# Patient Record
Sex: Female | Born: 1950 | Hispanic: Yes | Marital: Married | Smoking: Former smoker
Health system: Southern US, Community
[De-identification: ages and names within clinical notes are randomized; demographics above are authoritative.]

## PROBLEM LIST (undated history)

## (undated) DIAGNOSIS — E079 Disorder of thyroid, unspecified: Secondary | ICD-10-CM

## (undated) DIAGNOSIS — F419 Anxiety disorder, unspecified: Secondary | ICD-10-CM

## (undated) DIAGNOSIS — I10 Essential (primary) hypertension: Secondary | ICD-10-CM

## (undated) DIAGNOSIS — E119 Type 2 diabetes mellitus without complications: Secondary | ICD-10-CM

## (undated) DIAGNOSIS — E039 Hypothyroidism, unspecified: Secondary | ICD-10-CM

## (undated) HISTORY — DX: Type 2 diabetes mellitus without complications: E11.9

## (undated) HISTORY — PX: COSMETIC SURGERY: SHX468

## (undated) HISTORY — DX: Essential (primary) hypertension: I10

## (undated) HISTORY — DX: Disorder of thyroid, unspecified: E07.9

## (undated) HISTORY — DX: Anxiety disorder, unspecified: F41.9

---

## 2014-03-07 HISTORY — PX: GALLBLADDER SURGERY: SHX652

## 2014-06-06 HISTORY — PX: SHOULDER SURGERY: SHX246

## 2015-06-03 ENCOUNTER — Ambulatory Visit (INDEPENDENT_AMBULATORY_CARE_PROVIDER_SITE_OTHER): Payer: Self-pay | Admitting: Family Medicine

## 2015-06-03 ENCOUNTER — Ambulatory Visit (INDEPENDENT_AMBULATORY_CARE_PROVIDER_SITE_OTHER): Payer: Self-pay

## 2015-06-03 VITALS — BP 114/70 | HR 71 | Temp 97.7°F | Resp 17

## 2015-06-03 DIAGNOSIS — M25572 Pain in left ankle and joints of left foot: Secondary | ICD-10-CM

## 2015-06-03 DIAGNOSIS — S93402A Sprain of unspecified ligament of left ankle, initial encounter: Secondary | ICD-10-CM

## 2015-06-03 MED ORDER — NAPROXEN SODIUM 550 MG PO TABS: 550.0000 mg | ORAL_TABLET | Freq: Two times a day (BID) | ORAL | Status: DC

## 2015-06-03 NOTE — Progress Notes (Signed)
    MRN: [id number] DOB: Jan 03, 1951  Subjective:   Heather Vang is a 64 y.o. female presenting for chief complaint of Foot Swelling  Reports 9 day history of left ankle swelling since she twisted her ankle walking down steps. she felt severe pain in her left ankle thereafter, had a lot of difficulty ambulating on her own. In the following days, she experienced significant swelling of her left ankle and bruising. She reports that the swelling and bruising has improved but has not resolved and she feels pain with walking and foot movement. She has tried icing a couple of times, Flanax without significant relief. Of note, patient has not rested, she is not elevating her leg either, she walks daily. Denies any other aggravating or relieving factors, no other questions or concerns.  Jaquel has a current medication list which includes the following prescription(s): amlodipine, estazolam, levothyroxine, metformin, and aspirin. She has No Known Allergies.  Iram  has a past medical history of Diabetes mellitus without complication; Anxiety; Hypertension; and Thyroid disease. Also  has past surgical history that includes Cosmetic surgery; Gallbladder surgery (03/07/2014); and Shoulder surgery (06/06/2014).  ROS As in subjective.  Objective:   Vitals: BP 114/70 mmHg  Pulse 71  Temp(Src) 97.7 F (36.5 C) (Oral)  Resp 17  LMP   Physical Exam  Constitutional: She is oriented to person, place, and time. She appears well-developed and well-nourished.  Cardiovascular: Normal rate.   Pulmonary/Chest: Effort normal.  Musculoskeletal:       Left ankle: She exhibits decreased range of motion (inversion and eversion) and swelling. She exhibits no ecchymosis, no deformity and no laceration. Tenderness. Lateral malleolus, AITFL and head of 5th metatarsal tenderness found. No medial malleolus, no posterior TFL and no proximal fibula tenderness found.  Neurological: She is alert and oriented to person,  place, and time.  Skin: Skin is warm and dry. No rash noted. No erythema. No pallor.   [facility] reading (PRIMARY) by  Dr. Marin Comment and PA-Nataliyah Packham. Left ankle - possible avulsion fracture near base of 5th versus chronic bony abnormality.  Dg Ankle Complete Left  06/03/2015   CLINICAL DATA:  Pain following fall  EXAM: LEFT ANKLE COMPLETE - 3+ VIEW  COMPARISON:  None.  FINDINGS: Frontal, oblique, and lateral views were obtained. There is mild generalized soft tissue swelling. There is no fracture or joint effusion. The ankle mortise appears intact. There is a spur arising from the inferior calcaneus. There is an accessory ossicle lateral to the cuboid.  IMPRESSION: Accessory ossicle lateral to cuboid. Small inferior calcaneal spur. No fracture. Mortise intact. There is mild generalized soft tissue swelling.   Electronically Signed   By: Lowella Grip III M.D.   On: 06/03/2015 11:56   Assessment and Plan :   1. Left ankle sprain, initial encounter 2. Left ankle pain - X-ray reassuring, advised RICE, anaprox for pain and inflammation, patient declined ankle brace here in office and plans on obtaining her own - RTC if no improvement in 2 weeks, consider PT, ortho  Jaynee Eagles, PA-C [facility] [place] [phone] 06/03/2015 11:35 AM

## 2015-06-03 NOTE — Patient Instructions (Signed)
Esguince de tobillo (Ankle Sprain)  Un esguince de tobillo es una lesin en los tejidos fuertes y fibrosos (ligamentos) que mantienen unidos los huesos de la articulacin del tobillo.  CAUSAS  Las causas pueden ser una cada o la torcedura del tobillo. Los esguinces de tobillo ocurren con ms frecuencia al pisar con el borde exterior del pie, lo que hace que el tobillo se vuelva hacia adentro. Las personas que practican deportes son ms propensas a este tipo de lesiones.  SNTOMAS   Dolor en el tobillo. El dolor puede aparecer durante el reposo o slo al tratar de ponerse de pie o caminar.  Hinchazn.  Hematomas. Los hematomas pueden aparecer inmediatamente o luego de 1 a 2 das despus de la lesin.  Dificultad para pararse o caminar, especialmente al doblar en esquinas o al cambiar de direccin. DIAGNSTICO  El mdico le preguntar detalles acerca de la lesin y le har un examen fsico del tobillo para determinar si tiene un esguince. Durante el examen fsico, el mdico apretar y Midwife presin en reas especficas del pie y del tobillo. El mdico tratar de Film/video editor tobillo en ciertas direcciones. Le indicarn una radiografa para descartar la fractura de un hueso o que un ligamento no se haya separado de uno de los huesos del tobillo (fractura por avulsin).  TRATAMIENTO  Algunos tipos de soporte podrn ayudarlo a estabilizar el tobillo. El profesional que lo asiste le dar las indicaciones. Tambin podr indicarle que use medicamentos para Glass blower/designer. Si el esguince es grave, su mdico podr derivarlo a un cirujano que lo ayudar a Secretary/administrator funcin de las partes afectadas del sistema esqueltico (ortopedista) o a un fisioterapeuta.  INSTRUCCIONES PARA EL CUIDADO EN EL HOGAR   Aplique hielo en la articulacin lesionada durante 1  2 das o segn lo que le indique su mdico. La aplicacin del hielo ayuda a reducir la inflamacin y Conservation officer, historic buildings.  Ponga el hielo en una bolsa  plstica.  Colquese una toalla entre la piel y la bolsa de hielo.  Deje el hielo en el lugar durante 15 a 20 minutos por vez, cada 2 horas mientras est despierto.  Slo tome medicamentos de venta libre o recetados para Glass blower/designer, las molestias o bajar la fiebre segn las indicaciones de su mdico.  Eleve el tobillo lesionado por encima del nivel del corazn tanto como pueda durante 2 o 3 das.  Si su mdico le indica el uso de [place], selas segn las instrucciones. Gradualmente lleve el peso sobre el tobillo [place]. Siga usando muletas o un bastn hasta que pueda caminar sin sentir dolor en el tobillo.  Si tiene una frula de yeso, sela como lo indique su mdico. No se apoye en ninguna cosa ms dura que una [employer] primeras 24 horas. No ponga peso sobre la frula. No permita que se moje. Puede quitrsela para tomar una ducha o un bao.  Pueden haberle colocado un vendaje elstico para usar alrededor del tobillo para darle soporte. Si el vendaje elstico est muy ajustado (siente adormecimiento u hormigueo o el pie est fro y [place]), ajstelo para que sea ms cmodo.  Si usted tiene una frula de [place], puede soplar o dejar salir el aire para que sea ms cmodo. Puede quitarse la frula por la noche y antes de tomar una ducha o un bao. Mueva los dedos de los pies en la frula varias veces al da para disminuir la hinchazn. SOLICITE ATENCIN MDICA SI:  Le aumenta rpidamente el [place].  Los dedos de los pies estn extremadamente fros o pierde la sensibilidad en el pie.  El dolor no se alivia con los [employer]. SOLICITE ATENCIN MDICA DE INMEDIATO SI:   Los dedos de los pies estn adormecidos o de [employer].  Tiene un dolor agudo que va aumentando. ASEGRESE DE QUE:   Comprende estas instrucciones.  Controlar su enfermedad.  Solicitar ayuda de inmediato si no mejora o empeora. Document Released: 11/23/2005 Document Revised:  08/17/2012 [facility] Patient Information 2015 [place]. This information is not intended to replace advice given to you by your health care provider. Make sure you discuss any questions you have with your health care provider.

## 2015-06-11 NOTE — Progress Notes (Signed)
The recorded information has been reviewed and considered. Agree with A/P. Dr Marin Comment

## 2016-02-19 IMAGING — CR DG ANKLE COMPLETE 3+V*L*
4 series · 4 of 4 positions shown · non-contrast
Comparison: None.

CLINICAL DATA: Pain following fall

EXAM:
LEFT ANKLE COMPLETE - 3+ VIEW

[AP]
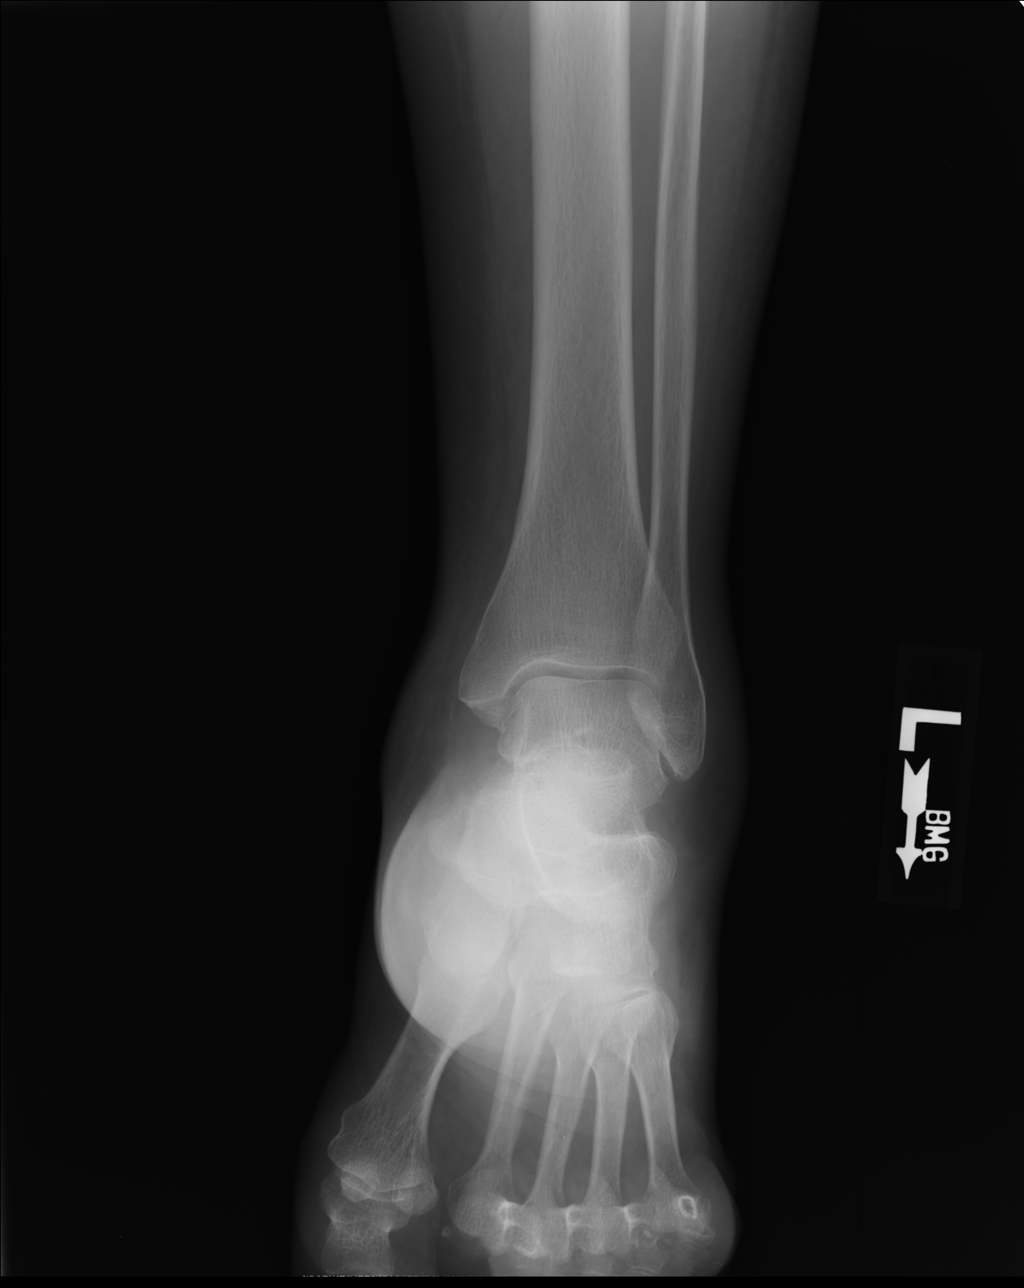

[ap obl int rot]
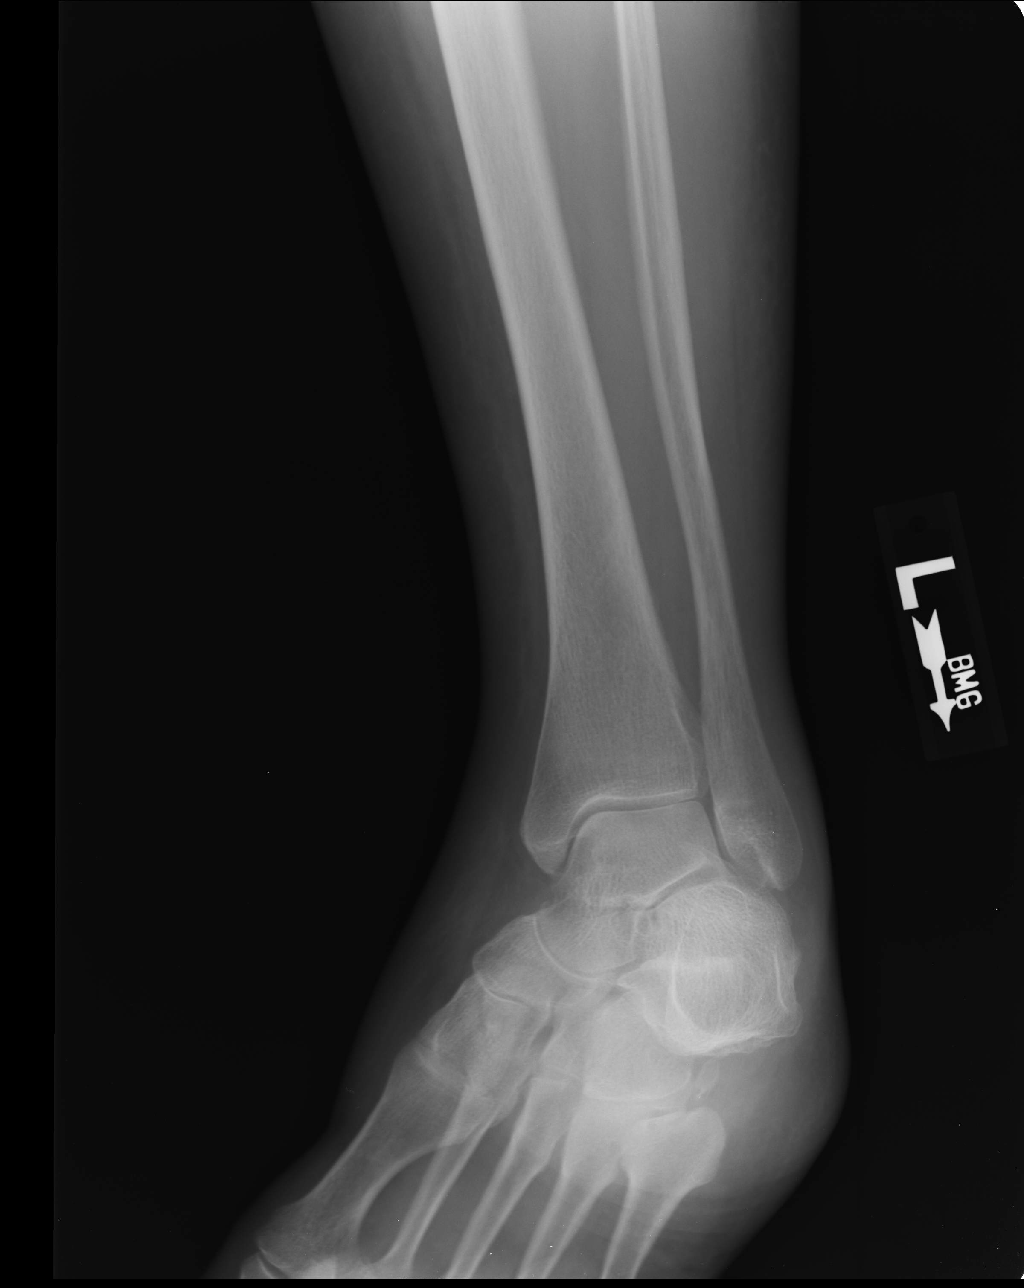

[medial obl]
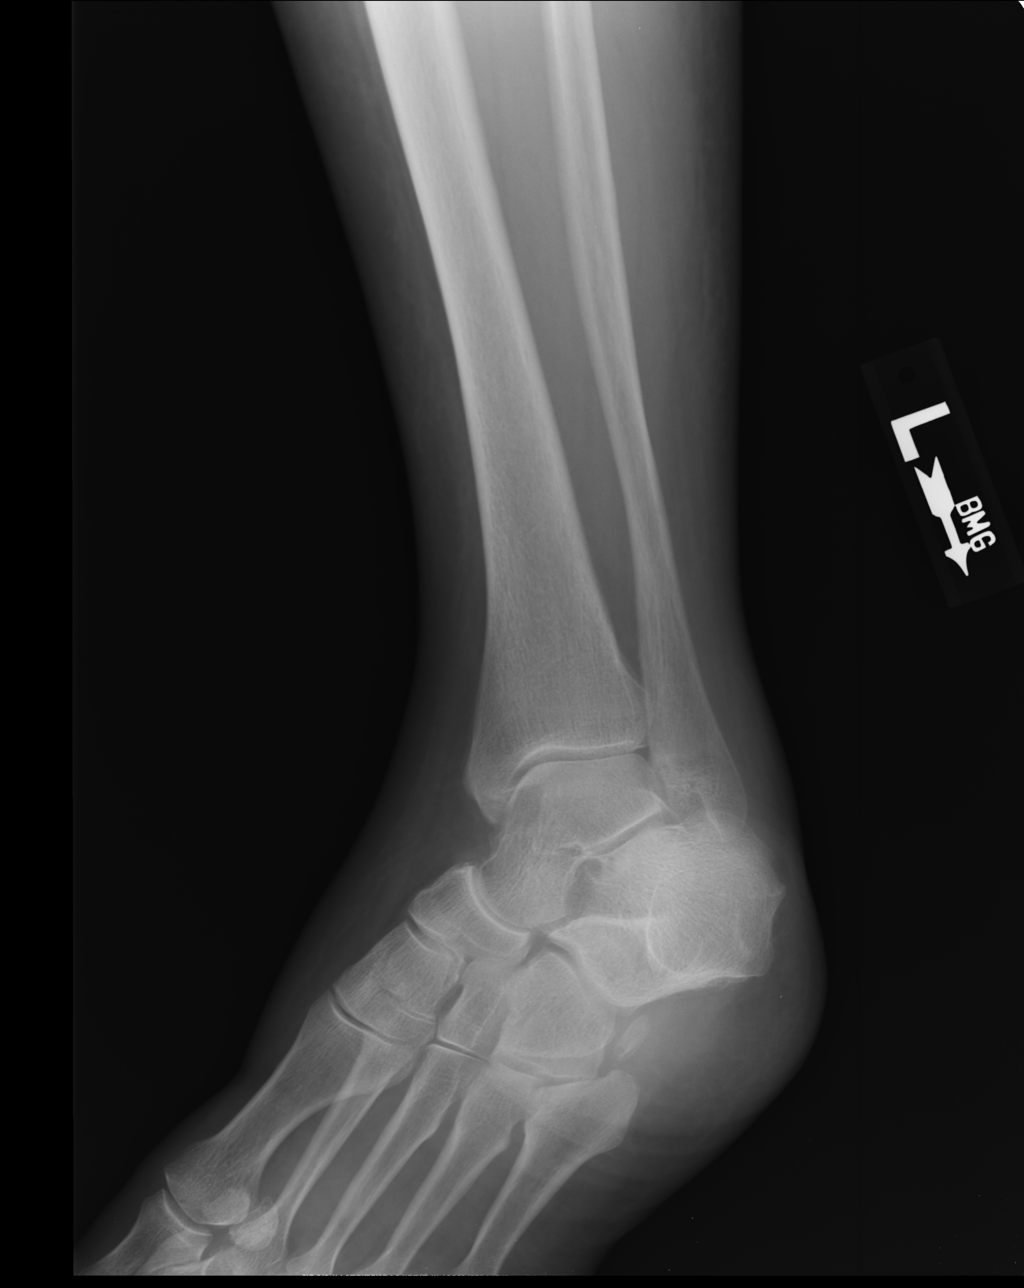

[lateral]
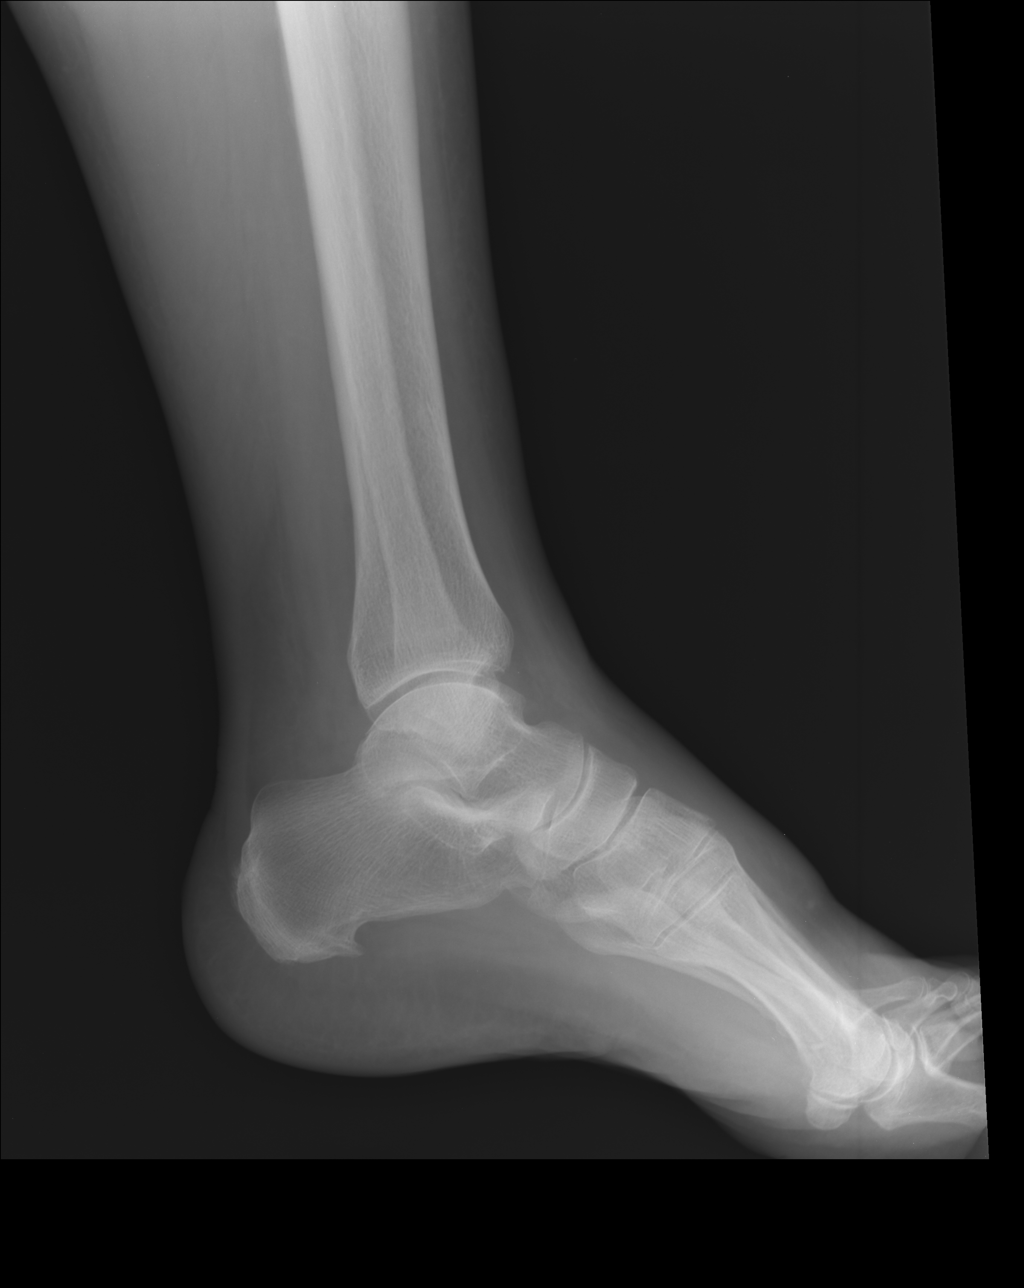

[4 of 4 positions shown; findings below may reference images not displayed]

FINDINGS: Frontal, oblique, and lateral views were obtained. There is mild
generalized soft tissue swelling. There is no fracture or joint
effusion. The ankle mortise appears intact. There is a spur arising
from the inferior calcaneus. There is an accessory ossicle lateral
to the cuboid.
IMPRESSION: Accessory ossicle lateral to cuboid. Small inferior calcaneal spur.
No fracture. Mortise intact. There is mild generalized soft tissue
swelling.

## 2022-07-01 ENCOUNTER — Encounter (HOSPITAL_COMMUNITY): Payer: Self-pay

## 2022-07-01 ENCOUNTER — Emergency Department (HOSPITAL_COMMUNITY): Payer: Self-pay

## 2022-07-01 ENCOUNTER — Inpatient Hospital Stay (HOSPITAL_COMMUNITY)
Admission: EM | Admit: 2022-07-01 | Discharge: 2022-07-14 | DRG: 853 | Disposition: A | Discharge status: Home / Self Care | Payer: Self-pay | Attending: Family Medicine | Admitting: Family Medicine

## 2022-07-01 DIAGNOSIS — T801XXA Vascular complications following infusion, transfusion and therapeutic injection, initial encounter: Secondary | ICD-10-CM | POA: Diagnosis not present

## 2022-07-01 DIAGNOSIS — Z79899 Other long term (current) drug therapy: Secondary | ICD-10-CM

## 2022-07-01 DIAGNOSIS — K5792 Diverticulitis of intestine, part unspecified, without perforation or abscess without bleeding: Secondary | ICD-10-CM

## 2022-07-01 DIAGNOSIS — Z933 Colostomy status: Principal | ICD-10-CM

## 2022-07-01 DIAGNOSIS — Z5331 Laparoscopic surgical procedure converted to open procedure: Secondary | ICD-10-CM

## 2022-07-01 DIAGNOSIS — Z833 Family history of diabetes mellitus: Secondary | ICD-10-CM

## 2022-07-01 DIAGNOSIS — Y848 Other medical procedures as the cause of abnormal reaction of the patient, or of later complication, without mention of misadventure at the time of the procedure: Secondary | ICD-10-CM | POA: Diagnosis not present

## 2022-07-01 DIAGNOSIS — K5780 Diverticulitis of intestine, part unspecified, with perforation and abscess without bleeding: Secondary | ICD-10-CM

## 2022-07-01 DIAGNOSIS — I10 Essential (primary) hypertension: Secondary | ICD-10-CM | POA: Diagnosis present

## 2022-07-01 DIAGNOSIS — Z79890 Hormone replacement therapy (postmenopausal): Secondary | ICD-10-CM

## 2022-07-01 DIAGNOSIS — A419 Sepsis, unspecified organism: Principal | ICD-10-CM | POA: Diagnosis present

## 2022-07-01 DIAGNOSIS — I808 Phlebitis and thrombophlebitis of other sites: Secondary | ICD-10-CM | POA: Diagnosis not present

## 2022-07-01 DIAGNOSIS — K5720 Diverticulitis of large intestine with perforation and abscess without bleeding: Secondary | ICD-10-CM | POA: Diagnosis present

## 2022-07-01 DIAGNOSIS — E876 Hypokalemia: Secondary | ICD-10-CM | POA: Diagnosis not present

## 2022-07-01 DIAGNOSIS — E039 Hypothyroidism, unspecified: Secondary | ICD-10-CM | POA: Diagnosis present

## 2022-07-01 DIAGNOSIS — E119 Type 2 diabetes mellitus without complications: Secondary | ICD-10-CM | POA: Diagnosis present

## 2022-07-01 DIAGNOSIS — K658 Other peritonitis: Secondary | ICD-10-CM | POA: Diagnosis present

## 2022-07-01 LAB — CBC WITH DIFFERENTIAL/PLATELET
Abs Immature Granulocytes: 0.11 10*3/uL — ABNORMAL HIGH (ref 0.00–0.07)
Basophils Absolute: 0 10*3/uL (ref 0.0–0.1)
Basophils Relative: 0 %
Eosinophils Absolute: 0 10*3/uL (ref 0.0–0.5)
Eosinophils Relative: 0 %
HCT: 43 % (ref 36.0–46.0)
Hemoglobin: 14.4 g/dL (ref 12.0–15.0)
Immature Granulocytes: 1 %
Lymphocytes Relative: 8 %
Lymphs Abs: 1.7 10*3/uL (ref 0.7–4.0)
MCH: 30.3 pg (ref 26.0–34.0)
MCHC: 33.5 g/dL (ref 30.0–36.0)
MCV: 90.5 fL (ref 80.0–100.0)
Monocytes Absolute: 0.8 10*3/uL (ref 0.1–1.0)
Monocytes Relative: 4 %
Neutro Abs: 17.9 10*3/uL — ABNORMAL HIGH (ref 1.7–7.7)
Neutrophils Relative %: 87 %
Platelets: 275 10*3/uL (ref 150–400)
RBC: 4.75 MIL/uL (ref 3.87–5.11)
RDW: 14.3 % (ref 11.5–15.5)
WBC: 20.7 10*3/uL — ABNORMAL HIGH (ref 4.0–10.5)
nRBC: 0 % (ref 0.0–0.2)

## 2022-07-01 LAB — URINALYSIS, ROUTINE W REFLEX MICROSCOPIC
Bilirubin Urine: NEGATIVE
Glucose, UA: >=500 mg/dL — AB
Hgb urine dipstick: NEGATIVE
Ketones, ur: 20 mg/dL — AB
Nitrite: NEGATIVE
Protein, ur: 30 mg/dL — AB
Specific Gravity, Urine: 1.031 — ABNORMAL HIGH (ref 1.005–1.030)
pH: 6 (ref 5.0–8.0)

## 2022-07-01 LAB — COMPREHENSIVE METABOLIC PANEL
ALT: 19 U/L (ref 0–44)
AST: 13 U/L — ABNORMAL LOW (ref 15–41)
Albumin: 4 g/dL (ref 3.5–5.0)
Alkaline Phosphatase: 59 U/L (ref 38–126)
Anion gap: 13 (ref 5–15)
BUN: 18 mg/dL (ref 8–23)
CO2: 20 mmol/L — ABNORMAL LOW (ref 22–32)
Calcium: 9.5 mg/dL (ref 8.9–10.3)
Chloride: 106 mmol/L (ref 98–111)
Creatinine, Ser: 0.82 mg/dL (ref 0.44–1.00)
GFR, Estimated: >60 mL/min (ref >60)
Glucose, Bld: 139 mg/dL — ABNORMAL HIGH (ref 70–99)
Potassium: 3.5 mmol/L (ref 3.5–5.1)
Sodium: 139 mmol/L (ref 135–145)
Total Bilirubin: 1 mg/dL (ref 0.3–1.2)
Total Protein: 8 g/dL (ref 6.5–8.1)

## 2022-07-01 LAB — LIPASE, BLOOD: Lipase: 22 U/L (ref 11–51)

## 2022-07-01 LAB — MAGNESIUM: Magnesium: 2.1 mg/dL (ref 1.7–2.4)

## 2022-07-01 MED ORDER — PIPERACILLIN-TAZOBACTAM 3.375 G IVPB 30 MIN
3.3750 g | Freq: Once | INTRAVENOUS | Status: AC
Start: 2022-07-01 — End: 2022-07-01
  Administered 2022-07-01: 3.375 g via INTRAVENOUS
  Filled 2022-07-01: qty 50

## 2022-07-01 MED ORDER — FENTANYL CITRATE PF 50 MCG/ML IJ SOSY: 25.0000 ug | PREFILLED_SYRINGE | INTRAMUSCULAR | Status: DC | PRN

## 2022-07-01 MED ORDER — ONDANSETRON HCL 4 MG/2ML IJ SOLN
4.0000 mg | Freq: Four times a day (QID) | INTRAMUSCULAR | Status: DC | PRN
  Administered 2022-07-05 – 2022-07-09 (×2): 4 mg via INTRAVENOUS
  Filled 2022-07-01: qty 2

## 2022-07-01 MED ORDER — ACETAMINOPHEN 325 MG PO TABS
650.0000 mg | ORAL_TABLET | Freq: Four times a day (QID) | ORAL | Status: DC | PRN
Start: 2022-07-01 — End: 2022-07-08
  Administered 2022-07-08: 650 mg via ORAL
  Filled 2022-07-01 (×2): qty 2

## 2022-07-01 MED ORDER — OXYCODONE-ACETAMINOPHEN 5-325 MG PO TABS
1.0000 | ORAL_TABLET | Freq: Once | ORAL | Status: AC
  Administered 2022-07-01: 1 via ORAL
  Filled 2022-07-01: qty 1

## 2022-07-01 MED ORDER — SODIUM CHLORIDE 0.9 % IV BOLUS
1000.0000 mL | Freq: Once | INTRAVENOUS | Status: AC
  Administered 2022-07-01: 1000 mL via INTRAVENOUS

## 2022-07-01 MED ORDER — ONDANSETRON HCL 4 MG/2ML IJ SOLN
4.0000 mg | Freq: Once | INTRAMUSCULAR | Status: AC
  Administered 2022-07-01: 4 mg via INTRAVENOUS
  Filled 2022-07-01: qty 2

## 2022-07-01 MED ORDER — LACTATED RINGERS IV SOLN: INTRAVENOUS | Status: DC

## 2022-07-01 MED ORDER — PIPERACILLIN-TAZOBACTAM 3.375 G IVPB
3.3750 g | Freq: Three times a day (TID) | INTRAVENOUS | Status: DC
  Administered 2022-07-02 – 2022-07-14 (×35): 3.375 g via INTRAVENOUS
  Filled 2022-07-01 (×34): qty 50

## 2022-07-01 MED ORDER — IOHEXOL 300 MG/ML  SOLN
100.0000 mL | Freq: Once | INTRAMUSCULAR | Status: AC | PRN
  Administered 2022-07-01: 100 mL via INTRAVENOUS

## 2022-07-01 MED ORDER — LACTATED RINGERS IV BOLUS
1000.0000 mL | Freq: Once | INTRAVENOUS | Status: AC
  Administered 2022-07-01: 1000 mL via INTRAVENOUS

## 2022-07-01 MED ORDER — FENTANYL CITRATE PF 50 MCG/ML IJ SOSY
50.0000 ug | PREFILLED_SYRINGE | INTRAMUSCULAR | Status: DC | PRN
  Administered 2022-07-02: 50 ug via INTRAVENOUS
  Filled 2022-07-01: qty 1

## 2022-07-01 MED ORDER — MORPHINE SULFATE (PF) 4 MG/ML IV SOLN
4.0000 mg | Freq: Once | INTRAVENOUS | Status: AC
  Administered 2022-07-01: 4 mg via INTRAVENOUS
  Filled 2022-07-01: qty 1

## 2022-07-01 MED ORDER — INSULIN ASPART 100 UNIT/ML IJ SOLN
0.0000 [IU] | Freq: Four times a day (QID) | INTRAMUSCULAR | Status: DC
  Filled 2022-07-01: qty 0.06

## 2022-07-01 MED ORDER — NALOXONE HCL 0.4 MG/ML IJ SOLN: 0.4000 mg | INTRAMUSCULAR | Status: DC | PRN

## 2022-07-01 MED ORDER — ACETAMINOPHEN 650 MG RE SUPP: 650.0000 mg | Freq: Four times a day (QID) | RECTAL | Status: DC | PRN

## 2022-07-01 NOTE — Progress Notes (Signed)
Pharmacy Antibiotic Note  Heather Vang is a 71 y.o. female who presented to the ED on 07/01/2022 with c/o abdominal pain.  Abdominal CT showed findings consistent with perforated diverticulitis and cholelithiasis. Pharmacy has been consulted to dose zosyn for intra-abdominal infection.  Plan: - zosyn 3.375 gm IV q8h (infuse over 4 hrs) - With good renal function, pharmacy will sign off for abx consult.  Reconsult [place] if need further assistance. __________________________________________  Height: 5' 0.24" (153 cm) Weight: 70 kg (154 lb 5.2 oz) IBW/kg (Calculated) : 46.04  Temp (24hrs), Avg:98.2 F (36.8 C), Min:97.8 F (36.6 C), Max:98.5 F (36.9 C)  Recent Labs  Lab 07/01/22 1637  WBC 20.7*  CREATININE 0.82    Estimated Creatinine Clearance: 57.6 mL/min (by C-G formula based on SCr of 0.82 mg/dL).    No Known Allergies   Thank you for allowing pharmacy to be a part of this patient's care.  Lynelle Doctor 07/01/2022 9:10 PM

## 2022-07-01 NOTE — ED Triage Notes (Signed)
Interpreter offered: Pt wishes to have her family member translate at this time. PT c/o abdominal pain, nausea, and vomiting since Monday.

## 2022-07-01 NOTE — ED Provider Notes (Signed)
[facility] DEPT Provider Note   CSN: [id number] Arrival date & time: 07/01/22  1530     History  Chief Complaint  Patient presents with   Abdominal Pain    Heather Vang is a 71 y.o. female.  71 yo F with a chief complaints of diffuse abdominal discomfort.  This been going on since Monday so about 3 days, diffusely about the abdomen and has had some nausea but no vomiting no diarrhea no constipation no fevers.  Feels like she is bloated.  Has never had a prior like this happen to her before.  Denies prior history of abdominal surgery.   Abdominal Pain      Home Medications Prior to Admission medications   Medication Sig Start Date End Date Taking? Authorizing Provider  amLODipine (NORVASC) 5 MG tablet Take 5 mg by mouth daily.    [provider]  aspirin 81 MG tablet Take 81 mg by mouth daily.    [provider]  estazolam (PROSOM) 2 MG tablet Take 2 mg by mouth at bedtime.    [provider]  levothyroxine (SYNTHROID, LEVOTHROID) 175 MCG tablet Take 175 mcg by mouth daily before breakfast.    [provider]  metFORMIN (GLUCOPHAGE-XR) 500 MG 24 hr tablet Take 500 mg by mouth daily with breakfast.    [provider]  naproxen sodium (ANAPROX DS) 550 MG tablet Take 1 tablet (550 mg total) by mouth 2 (two) times daily with a meal. 06/03/15   Jaynee Eagles, PA-C      Allergies    Patient has no known allergies.    Review of Systems   Review of Systems  Gastrointestinal:  Positive for abdominal pain.    Physical Exam Updated Vital Signs BP (!) 105/50   Pulse 87   Temp 97.8 F (36.6 C) (Oral)   Resp 16   Ht 5' 0.24" (1.53 m)   Wt 70 kg   SpO2 93%   BMI 29.90 kg/m  Physical Exam Vitals and nursing note reviewed.  Constitutional:      General: She is not in acute distress.    Appearance: She is well-developed. She is not diaphoretic.  HENT:     Head: Normocephalic and atraumatic.   Eyes:     Pupils: Pupils are equal, round, and reactive to light.  Cardiovascular:     Rate and Rhythm: Normal rate and regular rhythm.     Heart sounds: No murmur heard.    No friction rub. No gallop.  Pulmonary:     Effort: Pulmonary effort is normal.     Breath sounds: No wheezing or rales.  Abdominal:     General: There is no distension.     Palpations: Abdomen is soft.     Tenderness: There is no abdominal tenderness.     Comments: Mild distention tympanic to percussion.  Mild diffuse abdominal discomfort worse on the right side than the left.  Musculoskeletal:        General: No tenderness.     Cervical back: Normal range of motion and neck supple.  Skin:    General: Skin is warm and dry.  Neurological:     Mental Status: She is alert and oriented to person, place, and time.  Psychiatric:        Behavior: Behavior normal.     ED Results / Procedures / Treatments   Labs (all labs ordered are listed, but only abnormal results are displayed) Labs Reviewed  CBC  WITH DIFFERENTIAL/PLATELET - Abnormal; Notable for the following components:      Result Value   WBC 20.7 (*)    Neutro Abs 17.9 (*)    Abs Immature Granulocytes 0.11 (*)    All other components within normal limits  COMPREHENSIVE METABOLIC PANEL - Abnormal; Notable for the following components:   CO2 20 (*)    Glucose, Bld 139 (*)    AST 13 (*)    All other components within normal limits  URINALYSIS, ROUTINE W REFLEX MICROSCOPIC - Abnormal; Notable for the following components:   Specific Gravity, Urine 1.031 (*)    Glucose, UA >=500 (*)    Ketones, ur 20 (*)    Protein, ur 30 (*)    Leukocytes,Ua SMALL (*)    Bacteria, UA RARE (*)    All other components within normal limits  LIPASE, BLOOD    EKG None  Radiology CT Abdomen Pelvis W Contrast  Result Date: 07/01/2022 CLINICAL DATA:  Bowel obstruction suspected. Abdominal pain, nausea, and vomiting since Monday. EXAM: CT ABDOMEN AND PELVIS WITH  CONTRAST TECHNIQUE: Multidetector CT imaging of the abdomen and pelvis was performed using the standard protocol following bolus administration of intravenous contrast. RADIATION DOSE REDUCTION: This exam was performed according to the departmental dose-optimization program which includes automated exposure control, adjustment of the mA and/or kV according to patient size and/or use of iterative reconstruction technique. CONTRAST:  117m OMNIPAQUE IOHEXOL 300 MG/ML  SOLN COMPARISON:  None Available. FINDINGS: Lower chest: Streaky infiltrates in the lung bases, likely linear atelectasis. Small esophageal hiatal hernia. Hepatobiliary: No focal liver lesions. Vague densities in the gallbladder likely representing small non radiopaque stones. No gallbladder wall thickening. No bile duct dilatation. Pancreas: Unremarkable. No pancreatic ductal dilatation or surrounding inflammatory changes. Spleen: Normal in size without focal abnormality. Adrenals/Urinary Tract: Adrenal glands are unremarkable. Kidneys are normal, without renal calculi, focal lesion, or hydronephrosis. Bladder is unremarkable. Stomach/Bowel: Stomach, small bowel, and colon are not abnormally distended. Fluid-filled nondistended small bowel are present. Diverticulosis of the sigmoid colon with stranding around the mid/distal descending colon. Changes are consistent with acute diverticulitis. Mild wall thickening of adjacent small bowel suggesting reactive inflammation or possibly enteritis. Appendix is normal. Vascular/Lymphatic: Aortic atherosclerosis. No enlarged abdominal or pelvic lymph nodes. Reproductive: Uterus and bilateral adnexa are unremarkable. Other: Diffuse free abdominal air is demonstrated with air extending into the abdominal hiatus. In the absence of recent surgery, this likely represents bowel perforation. Given additional finding of apparent acute diverticulitis, this likely represents a perforated diverticulitis. No free fluid in the  abdomen. Abdominal wall musculature appears intact. Musculoskeletal: No acute or significant osseous findings. IMPRESSION: 1. Diverticulosis of the colon with pericolonic inflammatory changes in the mid/distal descending colon consistent with acute diverticulitis. 2. Diffuse free air in the abdomen likely indicates perforated diverticulitis. 3. Small bowel wall thickening in the left abdomen likely represents reactive inflammation although enteritis could also have this appearance. 4. Cholelithiasis without evidence of acute cholecystitis. 5. Linear atelectasis in the lung bases. Critical Value/emergent results were called by telephone at the time of interpretation on 07/01/2022 at 7:55 pm to provider Dr. FTyrone Nine who verbally acknowledged these results. Electronically Signed   By: WLucienne CapersM.D.   On: 07/01/2022 20:02    Procedures Procedures    Medications Ordered in ED Medications  piperacillin-tazobactam (ZOSYN) IVPB 3.375 g (3.375 g Intravenous New Bag/Given 07/01/22 2016)  oxyCODONE-acetaminophen (PERCOCET/ROXICET) 5-325 MG per tablet 1 tablet (1 tablet Oral Given 07/01/22 1648)  iohexol (OMNIPAQUE) 300 MG/ML solution 100 mL (100 mLs Intravenous Contrast Given 07/01/22 1941)  morphine (PF) 4 MG/ML injection 4 mg (4 mg Intravenous Given 07/01/22 2015)  ondansetron (ZOFRAN) injection 4 mg (4 mg Intravenous Given 07/01/22 2013)  sodium chloride 0.9 % bolus 1,000 mL (1,000 mLs Intravenous New Bag/Given 07/01/22 2016)    ED Course/ Medical Decision Making/ A&P Clinical Course as of 07/01/22 2038  Wed Jul 01, 2022  1714 WBC(!): 20.7 [GL]    Clinical Course User Index [GL] Adolphus Birchwood, PA-C                           Medical Decision Making Risk Prescription drug management.   71 yo F with a chief complaints of abdominal pain.  This was diffusely about the abdomen going on for about 3 days.  Pain worse on the right than the left.  White count of almost 21,000, no LFT elevation lipase  normal.  CT scan of the abdomen pelvis concerning for perforated diverticulitis.  I discussed this with the radiologist.  I discussed the case with general surgery, Dr. Ninfa Linden will come and evaluate patient at bedside.  We will give the patient broad-spectrum antibiotics.  Will discuss with medicine for admission.  The patients results and plan were reviewed and discussed.   Any x-rays performed were independently reviewed by myself.   Differential diagnosis were considered with the presenting HPI.  Medications  piperacillin-tazobactam (ZOSYN) IVPB 3.375 g (3.375 g Intravenous New Bag/Given 07/01/22 2016)  oxyCODONE-acetaminophen (PERCOCET/ROXICET) 5-325 MG per tablet 1 tablet (1 tablet Oral Given 07/01/22 1648)  iohexol (OMNIPAQUE) 300 MG/ML solution 100 mL (100 mLs Intravenous Contrast Given 07/01/22 1941)  morphine (PF) 4 MG/ML injection 4 mg (4 mg Intravenous Given 07/01/22 2015)  ondansetron (ZOFRAN) injection 4 mg (4 mg Intravenous Given 07/01/22 2013)  sodium chloride 0.9 % bolus 1,000 mL (1,000 mLs Intravenous New Bag/Given 07/01/22 2016)    Vitals:   07/01/22 1830 07/01/22 1926 07/01/22 1930 07/01/22 2015  BP: 102/60  96/86 (!) 105/50  Pulse: 85  82 87  Resp: '16  17 16  '$ Temp:  97.8 F (36.6 C)    TempSrc:  Oral    SpO2: 95%  94% 93%  Weight:      Height:        Final diagnoses:  Diverticulitis    Admission/ observation were discussed with the admitting physician, patient and/or family and they are comfortable with the plan.          Final Clinical Impression(s) / ED Diagnoses Final diagnoses:  Diverticulitis    Rx / DC Orders ED Discharge Orders     None         Deno Etienne, DO 07/01/22 2038

## 2022-07-01 NOTE — ED Provider Triage Note (Signed)
Emergency Medicine Provider Triage Evaluation Note  Heather Vang , a 70 y.o. female  was evaluated in triage.  Pt complains of generalized abdominal pain and distention since Monday.  She has also had persistent nausea and vomiting.  She has been unable to keep down any food.  Patient states she has not had any bowel movements and is not passing gas.  She does have history of cholecystectomy.  She denies fevers or chills.  Review of Systems  Positive:  Negative:   Physical Exam  BP (!) 119/57 (BP Location: Left Arm)   Pulse 97   Temp 98.5 F (36.9 C) (Oral)   Resp 17   Ht 5' 0.24" (1.53 m)   Wt 70 kg   SpO2 94%   BMI 29.90 kg/m  Gen:   Awake, no distress   Resp:  Normal effort  MSK:   Moves extremities without difficulty  Other:  Abdomen distended and tender to touch in all quadrants.  She has a negative Murphy sign. Voluntary guarding.   Medical Decision Making  Medically screening exam initiated at 4:24 PM.  Appropriate orders placed.  Kary Kos Vanderweide was informed that the remainder of the evaluation will be completed by another provider, this initial triage assessment does not replace that evaluation, and the importance of remaining in the ED until their evaluation is complete.     Adolphus Birchwood, [place] 07/01/22 1625

## 2022-07-01 NOTE — H&P (Signed)
CC: abdominal pain  HPI: This is a 71 year old Hispanic female who does not speak [place].  History was taken through an interpreter by phone.  She presents with diffuse abdominal pain.  This has been occurring for approximately 3 days.  She has had some nausea but no vomiting.  Her bowels have been moving normally.  She has no previous history of similar abdominal pain.   She has had no previous abdominal surgery.  Her history on the chart reports gallbladder surgery but she has no incisions on her abdomen and she has a gallbladder on CT scan she has no previous history of diverticulitis.  Her pain has improved since coming to the emergency department.  She denies fevers or chills.  She has no cardiopulmonary problems  Past Medical History:  Diagnosis Date   Anxiety    Diabetes mellitus without complication ([place])    Hypertension    Thyroid disease     Past Surgical History:  Procedure Laterality Date   COSMETIC SURGERY     stomach    GALLBLADDER SURGERY  03/07/2014   SHOULDER SURGERY  06/06/2014    Family History  Problem Relation Age of Onset   Diabetes Mother     Social:  reports that she has never smoked. She does not have any smokeless tobacco history on file. She reports that she does not drink alcohol and does not use drugs.  Allergies: No Known Allergies  Medications: I have reviewed the patient's current medications.  Results for orders placed or performed during the hospital encounter of 07/01/22 (from the past 48 hour(s))  CBC with Differential     Status: Abnormal   Collection Time: 07/01/22  4:37 PM  Result Value Ref Range   WBC 20.7 (H) 4.0 - 10.5 K/uL   RBC 4.75 3.87 - 5.11 MIL/uL   Hemoglobin 14.4 12.0 - 15.0 g/dL   HCT 43.0 36.0 - 46.0 %   MCV 90.5 80.0 - 100.0 fL   MCH 30.3 26.0 - 34.0 pg   MCHC 33.5 30.0 - 36.0 g/dL   RDW 14.3 11.5 - 15.5 %   Platelets 275 150 - 400 K/uL   nRBC 0.0 0.0 - 0.2 %   Neutrophils Relative % 87 %   Neutro Abs 17.9 (H) 1.7 -  7.7 K/uL   Lymphocytes Relative 8 %   Lymphs Abs 1.7 0.7 - 4.0 K/uL   Monocytes Relative 4 %   Monocytes Absolute 0.8 0.1 - 1.0 K/uL   Eosinophils Relative 0 %   Eosinophils Absolute 0.0 0.0 - 0.5 K/uL   Basophils Relative 0 %   Basophils Absolute 0.0 0.0 - 0.1 K/uL   Immature Granulocytes 1 %   Abs Immature Granulocytes 0.11 (H) 0.00 - 0.07 K/uL    Comment: Performed at [facility], [place] [address], [place], [place] [address]  Comprehensive metabolic panel     Status: Abnormal   Collection Time: 07/01/22  4:37 PM  Result Value Ref Range   Sodium 139 135 - 145 mmol/L   Potassium 3.5 3.5 - 5.1 mmol/L   Chloride 106 98 - 111 mmol/L   CO2 20 (L) 22 - 32 mmol/L   Glucose, Bld 139 (H) 70 - 99 mg/dL    Comment: Glucose reference range applies only to samples taken after fasting for at least 8 hours.   BUN 18 8 - 23 mg/dL   Creatinine, Ser 0.82 0.44 - 1.00 mg/dL   Calcium 9.5 8.9 - 10.3 mg/dL  Total Protein 8.0 6.5 - 8.1 g/dL   Albumin 4.0 3.5 - 5.0 g/dL   AST 13 (L) 15 - 41 U/L   ALT 19 0 - 44 U/L   Alkaline Phosphatase 59 38 - 126 U/L   Total Bilirubin 1.0 0.3 - 1.2 mg/dL   GFR, Estimated >60 >60 mL/min    Comment: (NOTE) Calculated using the CKD-EPI Creatinine Equation (2021)    Anion gap 13 5 - 15    Comment: Performed at [facility], [place] [address] [place], [place] [address]  Lipase, blood     Status: None   Collection Time: 07/01/22  4:37 PM  Result Value Ref Range   Lipase 22 11 - 51 U/L    Comment: Performed at [facility], [place] [address], [place], [place] [address]  Urinalysis, Routine w reflex microscopic Urine, Clean Catch     Status: Abnormal   Collection Time: 07/01/22  4:37 PM  Result Value Ref Range   Color, Urine YELLOW YELLOW   APPearance CLEAR CLEAR   Specific Gravity, Urine 1.031 (H) 1.005 - 1.030   pH 6.0 5.0 - 8.0   Glucose, UA >=500 (A) NEGATIVE mg/dL   Hgb urine dipstick NEGATIVE NEGATIVE    Bilirubin Urine NEGATIVE NEGATIVE   Ketones, ur 20 (A) NEGATIVE mg/dL   Protein, ur 30 (A) NEGATIVE mg/dL   Nitrite NEGATIVE NEGATIVE   Leukocytes,Ua SMALL (A) NEGATIVE   RBC / HPF 0-5 0 - 5 RBC/hpf   WBC, UA 11-20 0 - 5 WBC/hpf   Bacteria, UA RARE (A) NONE SEEN   Squamous Epithelial / LPF 0-5 0 - 5   Budding Yeast PRESENT     Comment: Performed at [facility], [place] [address] [place], [place] [address]    CT Abdomen Pelvis W Contrast  Result Date: 07/01/2022 CLINICAL DATA:  Bowel obstruction suspected. Abdominal pain, nausea, and vomiting since Monday. EXAM: CT ABDOMEN AND PELVIS WITH CONTRAST TECHNIQUE: Multidetector CT imaging of the abdomen and pelvis was performed using the standard protocol following bolus administration of intravenous contrast. RADIATION DOSE REDUCTION: This exam was performed according to the departmental dose-optimization program which includes automated exposure control, adjustment of the mA and/or kV according to patient size and/or use of iterative reconstruction technique. CONTRAST:  152m OMNIPAQUE IOHEXOL 300 MG/ML  SOLN COMPARISON:  None Available. FINDINGS: Lower chest: Streaky infiltrates in the lung bases, likely linear atelectasis. Small esophageal hiatal hernia. Hepatobiliary: No focal liver lesions. Vague densities in the gallbladder likely representing small non radiopaque stones. No gallbladder wall thickening. No bile duct dilatation. Pancreas: Unremarkable. No pancreatic ductal dilatation or surrounding inflammatory changes. Spleen: Normal in size without focal abnormality. Adrenals/Urinary Tract: Adrenal glands are unremarkable. Kidneys are normal, without renal calculi, focal lesion, or hydronephrosis. Bladder is unremarkable. Stomach/Bowel: Stomach, small bowel, and colon are not abnormally distended. Fluid-filled nondistended small bowel are present. Diverticulosis of the sigmoid colon with stranding around the mid/distal descending  colon. Changes are consistent with acute diverticulitis. Mild wall thickening of adjacent small bowel suggesting reactive inflammation or possibly enteritis. Appendix is normal. Vascular/Lymphatic: Aortic atherosclerosis. No enlarged abdominal or pelvic lymph nodes. Reproductive: Uterus and bilateral adnexa are unremarkable. Other: Diffuse free abdominal air is demonstrated with air extending into the abdominal hiatus. In the absence of recent surgery, this likely represents bowel perforation. Given additional finding of apparent acute diverticulitis, this likely represents a perforated diverticulitis. No free fluid in the abdomen. Abdominal wall musculature appears intact.  Musculoskeletal: No acute or significant osseous findings. IMPRESSION: 1. Diverticulosis of the colon with pericolonic inflammatory changes in the mid/distal descending colon consistent with acute diverticulitis. 2. Diffuse free air in the abdomen likely indicates perforated diverticulitis. 3. Small bowel wall thickening in the left abdomen likely represents reactive inflammation although enteritis could also have this appearance. 4. Cholelithiasis without evidence of acute cholecystitis. 5. Linear atelectasis in the lung bases. Critical Value/emergent results were called by telephone at the time of interpretation on 07/01/2022 at 7:55 pm to provider Dr. Tyrone Nine, who verbally acknowledged these results. Electronically Signed   By: Lucienne Capers M.D.   On: 07/01/2022 20:02    ROS - all of the below systems have been reviewed with the patient and positives are indicated with bold text General: chills, fever or night sweats Eyes: blurry vision or double vision ENT: epistaxis or sore throat Allergy/Immunology: itchy/watery eyes or nasal congestion Hematologic/Lymphatic: bleeding problems, blood clots or swollen lymph nodes Endocrine: temperature intolerance or unexpected weight changes Breast: new or changing breast lumps or nipple  discharge Resp: cough, shortness of breath, or wheezing CV: chest pain or dyspnea on exertion GI: as per HPI GU: dysuria, trouble voiding, or hematuria MSK: joint pain or joint stiffness Neuro: TIA or stroke symptoms Derm: pruritus and skin lesion changes Psych: anxiety and depression  PE Blood pressure (!) 105/50, pulse 87, temperature 97.8 F (36.6 C), temperature source Oral, resp. rate 16, height 5' 0.24" (1.53 m), weight 70 kg, SpO2 93 %. Constitutional: NAD; conversant; no deformities Eyes: Moist conjunctiva; no lid lag; anicteric; PERRL Neck: Trachea midline; no thyromegaly Lungs: Normal respiratory effort; no tactile fremitus CV: RRR; no palpable thrills; no pitting edema GI: Abd soft and obese with only mild to moderate tenderness with guarding in the left lower quadrant.  The rest of the abdomen is soft and nontender.  There is no frank peritonitis; no palpable hepatosplenomegaly MSK: Normal range of motion of extremities; no clubbing/cyanosis Psychiatric: Appropriate affect; alert and oriented x3 Lymphatic: No palpable cervical or axillary lymphadenopathy  Results for orders placed or performed during the hospital encounter of 07/01/22 (from the past 48 hour(s))  CBC with Differential     Status: Abnormal   Collection Time: 07/01/22  4:37 PM  Result Value Ref Range   WBC 20.7 (H) 4.0 - 10.5 K/uL   RBC 4.75 3.87 - 5.11 MIL/uL   Hemoglobin 14.4 12.0 - 15.0 g/dL   HCT 43.0 36.0 - 46.0 %   MCV 90.5 80.0 - 100.0 fL   MCH 30.3 26.0 - 34.0 pg   MCHC 33.5 30.0 - 36.0 g/dL   RDW 14.3 11.5 - 15.5 %   Platelets 275 150 - 400 K/uL   nRBC 0.0 0.0 - 0.2 %   Neutrophils Relative % 87 %   Neutro Abs 17.9 (H) 1.7 - 7.7 K/uL   Lymphocytes Relative 8 %   Lymphs Abs 1.7 0.7 - 4.0 K/uL   Monocytes Relative 4 %   Monocytes Absolute 0.8 0.1 - 1.0 K/uL   Eosinophils Relative 0 %   Eosinophils Absolute 0.0 0.0 - 0.5 K/uL   Basophils Relative 0 %   Basophils Absolute 0.0 0.0 - 0.1 K/uL    Immature Granulocytes 1 %   Abs Immature Granulocytes 0.11 (H) 0.00 - 0.07 K/uL    Comment: Performed at [facility], [place] [address], [place], [place] [address]  Comprehensive metabolic panel     Status: Abnormal   Collection Time: 07/01/22  4:37 PM  Result Value Ref Range   Sodium 139 135 - 145 mmol/L   Potassium 3.5 3.5 - 5.1 mmol/L   Chloride 106 98 - 111 mmol/L   CO2 20 (L) 22 - 32 mmol/L   Glucose, Bld 139 (H) 70 - 99 mg/dL    Comment: Glucose reference range applies only to samples taken after fasting for at least 8 hours.   BUN 18 8 - 23 mg/dL   Creatinine, Ser 0.82 0.44 - 1.00 mg/dL   Calcium 9.5 8.9 - 10.3 mg/dL   Total Protein 8.0 6.5 - 8.1 g/dL   Albumin 4.0 3.5 - 5.0 g/dL   AST 13 (L) 15 - 41 U/L   ALT 19 0 - 44 U/L   Alkaline Phosphatase 59 38 - 126 U/L   Total Bilirubin 1.0 0.3 - 1.2 mg/dL   GFR, Estimated >60 >60 mL/min    Comment: (NOTE) Calculated using the CKD-EPI Creatinine Equation (2021)    Anion gap 13 5 - 15    Comment: Performed at [facility], [place] [address] [place], [place] [address]  Lipase, blood     Status: None   Collection Time: 07/01/22  4:37 PM  Result Value Ref Range   Lipase 22 11 - 51 U/L    Comment: Performed at [facility], [place] [address], [place], [place] [address]  Urinalysis, Routine w reflex microscopic Urine, Clean Catch     Status: Abnormal   Collection Time: 07/01/22  4:37 PM  Result Value Ref Range   Color, Urine YELLOW YELLOW   APPearance CLEAR CLEAR   Specific Gravity, Urine 1.031 (H) 1.005 - 1.030   pH 6.0 5.0 - 8.0   Glucose, UA >=500 (A) NEGATIVE mg/dL   Hgb urine dipstick NEGATIVE NEGATIVE   Bilirubin Urine NEGATIVE NEGATIVE   Ketones, ur 20 (A) NEGATIVE mg/dL   Protein, ur 30 (A) NEGATIVE mg/dL   Nitrite NEGATIVE NEGATIVE   Leukocytes,Ua SMALL (A) NEGATIVE   RBC / HPF 0-5 0 - 5 RBC/hpf   WBC, UA 11-20 0 - 5 WBC/hpf   Bacteria, UA RARE (A) NONE SEEN    Squamous Epithelial / LPF 0-5 0 - 5   Budding Yeast PRESENT     Comment: Performed at [facility], [place] [address], [place], [place] [address]    CT Abdomen Pelvis W Contrast  Result Date: 07/01/2022 CLINICAL DATA:  Bowel obstruction suspected. Abdominal pain, nausea, and vomiting since Monday. EXAM: CT ABDOMEN AND PELVIS WITH CONTRAST TECHNIQUE: Multidetector CT imaging of the abdomen and pelvis was performed using the standard protocol following bolus administration of intravenous contrast. RADIATION DOSE REDUCTION: This exam was performed according to the departmental dose-optimization program which includes automated exposure control, adjustment of the mA and/or kV according to patient size and/or use of iterative reconstruction technique. CONTRAST:  166m OMNIPAQUE IOHEXOL 300 MG/ML  SOLN COMPARISON:  None Available. FINDINGS: Lower chest: Streaky infiltrates in the lung bases, likely linear atelectasis. Small esophageal hiatal hernia. Hepatobiliary: No focal liver lesions. Vague densities in the gallbladder likely representing small non radiopaque stones. No gallbladder wall thickening. No bile duct dilatation. Pancreas: Unremarkable. No pancreatic ductal dilatation or surrounding inflammatory changes. Spleen: Normal in size without focal abnormality. Adrenals/Urinary Tract: Adrenal glands are unremarkable. Kidneys are normal, without renal calculi, focal lesion, or hydronephrosis. Bladder is unremarkable. Stomach/Bowel: Stomach, small bowel, and colon are not abnormally distended. Fluid-filled nondistended small bowel are present. Diverticulosis of the sigmoid colon  with stranding around the mid/distal descending colon. Changes are consistent with acute diverticulitis. Mild wall thickening of adjacent small bowel suggesting reactive inflammation or possibly enteritis. Appendix is normal. Vascular/Lymphatic: Aortic atherosclerosis. No enlarged abdominal or pelvic lymph nodes.  Reproductive: Uterus and bilateral adnexa are unremarkable. Other: Diffuse free abdominal air is demonstrated with air extending into the abdominal hiatus. In the absence of recent surgery, this likely represents bowel perforation. Given additional finding of apparent acute diverticulitis, this likely represents a perforated diverticulitis. No free fluid in the abdomen. Abdominal wall musculature appears intact. Musculoskeletal: No acute or significant osseous findings. IMPRESSION: 1. Diverticulosis of the colon with pericolonic inflammatory changes in the mid/distal descending colon consistent with acute diverticulitis. 2. Diffuse free air in the abdomen likely indicates perforated diverticulitis. 3. Small bowel wall thickening in the left abdomen likely represents reactive inflammation although enteritis could also have this appearance. 4. Cholelithiasis without evidence of acute cholecystitis. 5. Linear atelectasis in the lung bases. Critical Value/emergent results were called by telephone at the time of interpretation on 07/01/2022 at 7:55 pm to provider Dr. Tyrone Nine, who verbally acknowledged these results. Electronically Signed   By: Lucienne Capers M.D.   On: 07/01/2022 20:02    I have personally reviewed the relevant CT scan and laboratory data  A/P: Sigmoid diverticulitis with perforation  On examination, she surprisingly does not have diffuse peritonitis.  I suspect she has walled off a perforation of diverticulitis.  There is mild inflammatory changes but no free fluid on her CT scan.  Through an interpreter and with her niece in the room I explained the diagnosis to her.  I then discussed conservative management of diverticulitis with bowel rest and IV antibiotics versus emergent surgical intervention with resection and a colostomy.  I explained the reasoning for each with her.  She does have a moderate amount of free air on her scan but again her abdominal exam is encouraging.  Given this and after  discussion with her and her family, we will attempt conservative management with bowel rest and IV antibiotics.  Should she acutely worsen, we will need to proceed to the operating room for resection and colostomy.  IV antibiotics have been started and we will continue this and we will repeat her labs in the morning.  She and her family agree with plans  Complex medical decision making    Coralie Keens, MD [facility], [place]

## 2022-07-02 ENCOUNTER — Other Ambulatory Visit: Payer: Self-pay

## 2022-07-02 ENCOUNTER — Encounter (HOSPITAL_COMMUNITY): Payer: Self-pay | Admitting: Internal Medicine

## 2022-07-02 DIAGNOSIS — I10 Essential (primary) hypertension: Secondary | ICD-10-CM | POA: Diagnosis present

## 2022-07-02 DIAGNOSIS — A419 Sepsis, unspecified organism: Secondary | ICD-10-CM | POA: Diagnosis present

## 2022-07-02 DIAGNOSIS — E039 Hypothyroidism, unspecified: Secondary | ICD-10-CM | POA: Diagnosis present

## 2022-07-02 DIAGNOSIS — K5780 Diverticulitis of intestine, part unspecified, with perforation and abscess without bleeding: Secondary | ICD-10-CM

## 2022-07-02 DIAGNOSIS — E119 Type 2 diabetes mellitus without complications: Secondary | ICD-10-CM

## 2022-07-02 LAB — COMPREHENSIVE METABOLIC PANEL
ALT: 17 U/L (ref 0–44)
AST: 12 U/L — ABNORMAL LOW (ref 15–41)
Albumin: 3.4 g/dL — ABNORMAL LOW (ref 3.5–5.0)
Alkaline Phosphatase: 50 U/L (ref 38–126)
Anion gap: 12 (ref 5–15)
BUN: 14 mg/dL (ref 8–23)
CO2: 21 mmol/L — ABNORMAL LOW (ref 22–32)
Calcium: 8.7 mg/dL — ABNORMAL LOW (ref 8.9–10.3)
Chloride: 104 mmol/L (ref 98–111)
Creatinine, Ser: 0.67 mg/dL (ref 0.44–1.00)
GFR, Estimated: >60 mL/min (ref >60)
Glucose, Bld: 106 mg/dL — ABNORMAL HIGH (ref 70–99)
Potassium: 3.8 mmol/L (ref 3.5–5.1)
Sodium: 137 mmol/L (ref 135–145)
Total Bilirubin: 1.1 mg/dL (ref 0.3–1.2)
Total Protein: 7.4 g/dL (ref 6.5–8.1)

## 2022-07-02 LAB — BASIC METABOLIC PANEL
Anion gap: 12 (ref 5–15)
BUN: 12 mg/dL (ref 8–23)
CO2: 18 mmol/L — ABNORMAL LOW (ref 22–32)
Calcium: 8.7 mg/dL — ABNORMAL LOW (ref 8.9–10.3)
Chloride: 107 mmol/L (ref 98–111)
Creatinine, Ser: 0.68 mg/dL (ref 0.44–1.00)
GFR, Estimated: >60 mL/min (ref >60)
Glucose, Bld: 94 mg/dL (ref 70–99)
Potassium: 3.6 mmol/L (ref 3.5–5.1)
Sodium: 137 mmol/L (ref 135–145)

## 2022-07-02 LAB — CBC
HCT: 41.9 % (ref 36.0–46.0)
Hemoglobin: 13.8 g/dL (ref 12.0–15.0)
MCH: 30.9 pg (ref 26.0–34.0)
MCHC: 32.9 g/dL (ref 30.0–36.0)
MCV: 93.9 fL (ref 80.0–100.0)
Platelets: 267 10*3/uL (ref 150–400)
RBC: 4.46 MIL/uL (ref 3.87–5.11)
RDW: 14.4 % (ref 11.5–15.5)
WBC: 13.8 10*3/uL — ABNORMAL HIGH (ref 4.0–10.5)
nRBC: 0 % (ref 0.0–0.2)

## 2022-07-02 LAB — CBC WITH DIFFERENTIAL/PLATELET
Abs Immature Granulocytes: 0.07 10*3/uL (ref 0.00–0.07)
Basophils Absolute: 0.1 10*3/uL (ref 0.0–0.1)
Basophils Relative: 0 %
Eosinophils Absolute: 0 10*3/uL (ref 0.0–0.5)
Eosinophils Relative: 0 %
HCT: 39.1 % (ref 36.0–46.0)
Hemoglobin: 13 g/dL (ref 12.0–15.0)
Immature Granulocytes: 0 %
Lymphocytes Relative: 9 %
Lymphs Abs: 1.8 10*3/uL (ref 0.7–4.0)
MCH: 31 pg (ref 26.0–34.0)
MCHC: 33.2 g/dL (ref 30.0–36.0)
MCV: 93.3 fL (ref 80.0–100.0)
Monocytes Absolute: 0.9 10*3/uL (ref 0.1–1.0)
Monocytes Relative: 4 %
Neutro Abs: 16.6 10*3/uL — ABNORMAL HIGH (ref 1.7–7.7)
Neutrophils Relative %: 87 %
Platelets: 259 10*3/uL (ref 150–400)
RBC: 4.19 MIL/uL (ref 3.87–5.11)
RDW: 14.5 % (ref 11.5–15.5)
WBC: 19.4 10*3/uL — ABNORMAL HIGH (ref 4.0–10.5)
nRBC: 0 % (ref 0.0–0.2)

## 2022-07-02 LAB — HIV ANTIBODY (ROUTINE TESTING W REFLEX): HIV Screen 4th Generation wRfx: NONREACTIVE

## 2022-07-02 LAB — GLUCOSE, CAPILLARY
Glucose-Capillary: 109 mg/dL — ABNORMAL HIGH (ref 70–99)
Glucose-Capillary: 77 mg/dL (ref 70–99)
Glucose-Capillary: 80 mg/dL (ref 70–99)
Glucose-Capillary: 85 mg/dL (ref 70–99)
Glucose-Capillary: 90 mg/dL (ref 70–99)
Glucose-Capillary: 99 mg/dL (ref 70–99)

## 2022-07-02 LAB — PROTIME-INR
INR: 1.1 (ref 0.8–1.2)
Prothrombin Time: 14.5 seconds (ref 11.4–15.2)

## 2022-07-02 LAB — HEMOGLOBIN A1C
Hgb A1c MFr Bld: 6.3 % — ABNORMAL HIGH (ref 4.8–5.6)
Mean Plasma Glucose: 134.11 mg/dL

## 2022-07-02 LAB — MAGNESIUM: Magnesium: 2 mg/dL (ref 1.7–2.4)

## 2022-07-02 LAB — LACTIC ACID, PLASMA: Lactic Acid, Venous: 1.5 mmol/L (ref 0.5–1.9)

## 2022-07-02 MED ORDER — HYDROMORPHONE HCL 1 MG/ML IJ SOLN
0.5000 mg | INTRAMUSCULAR | Status: DC | PRN
  Administered 2022-07-02 – 2022-07-08 (×16): 1 mg via INTRAVENOUS
  Administered 2022-07-09: 0.5 mg via INTRAVENOUS
  Administered 2022-07-09: 1 mg via INTRAVENOUS
  Filled 2022-07-02 (×19): qty 1

## 2022-07-02 MED ORDER — POTASSIUM CHLORIDE IN NACL 20-0.9 MEQ/L-% IV SOLN
INTRAVENOUS | Status: DC
  Filled 2022-07-02 (×2): qty 1000

## 2022-07-02 MED ORDER — INSULIN ASPART 100 UNIT/ML IJ SOLN: 0.0000 [IU] | INTRAMUSCULAR | Status: DC

## 2022-07-02 MED ORDER — ONDANSETRON HCL 4 MG/2ML IJ SOLN
4.0000 mg | Freq: Four times a day (QID) | INTRAMUSCULAR | Status: DC | PRN
  Filled 2022-07-02: qty 2

## 2022-07-02 MED ORDER — ONDANSETRON 4 MG PO TBDP: 4.0000 mg | ORAL_TABLET | Freq: Four times a day (QID) | ORAL | Status: DC | PRN

## 2022-07-02 MED ORDER — PIPERACILLIN-TAZOBACTAM 3.375 G IVPB: 3.3750 g | Freq: Three times a day (TID) | INTRAVENOUS | Status: DC

## 2022-07-02 MED ORDER — AMLODIPINE BESYLATE 5 MG PO TABS
5.0000 mg | ORAL_TABLET | Freq: Every day | ORAL | Status: DC
  Administered 2022-07-02: 5 mg via ORAL
  Filled 2022-07-02: qty 1

## 2022-07-02 MED ORDER — DIPHENHYDRAMINE HCL 50 MG/ML IJ SOLN: 12.5000 mg | Freq: Four times a day (QID) | INTRAMUSCULAR | Status: DC | PRN

## 2022-07-02 MED ORDER — ENOXAPARIN SODIUM 40 MG/0.4ML IJ SOSY
40.0000 mg | PREFILLED_SYRINGE | INTRAMUSCULAR | Status: DC
  Administered 2022-07-02 – 2022-07-06 (×5): 40 mg via SUBCUTANEOUS
  Filled 2022-07-02 (×5): qty 0.4

## 2022-07-02 MED ORDER — HYDROMORPHONE HCL 1 MG/ML IJ SOLN
1.0000 mg | INTRAMUSCULAR | Status: DC | PRN
  Administered 2022-07-02 (×2): 1 mg via INTRAVENOUS
  Filled 2022-07-02 (×3): qty 1

## 2022-07-02 MED ORDER — DIPHENHYDRAMINE HCL 12.5 MG/5ML PO ELIX: 12.5000 mg | ORAL_SOLUTION | Freq: Four times a day (QID) | ORAL | Status: DC | PRN

## 2022-07-02 MED ORDER — DEXTROSE IN LACTATED RINGERS 5 % IV SOLN: INTRAVENOUS | Status: DC

## 2022-07-02 MED ORDER — LEVOTHYROXINE SODIUM 100 MCG PO TABS
175.0000 ug | ORAL_TABLET | Freq: Every day | ORAL | Status: DC
Start: 2022-07-02 — End: 2022-07-14
  Administered 2022-07-02 – 2022-07-14 (×12): 175 ug via ORAL
  Filled 2022-07-02 (×12): qty 1

## 2022-07-02 NOTE — Progress Notes (Addendum)
PROGRESS NOTE    Heather Vang  JGG:[id number] DOB: 1951-07-07 DOA: 07/01/2022 PCP: Pcp, No   Brief Narrative: 71 year old with past medical history significant for diabetes type 2, hypertension, acquired hypothyroidism who presented complaining of abdominal pain she was found to have perforated diverticulum.  She was found to have leukocytosis white blood cell at 20, CT abdomen and pelvis show diverticulosis of the colon with pericolonic inflammatory changes in the mid to distal descending colon consistent with acute diverticulitis.  Diffuse free air in the abdomen likely indicate perforated diverticulitis.   Assessment & Plan:   Principal Problem:   Perforated diverticulum Active Problems:   Diverticulitis of colon with perforation   Sepsis ([place])   Hypertension   DM2 (diabetes mellitus, type 2) ([place])   Acquired hypothyroidism  1-Perforated diverticulitis: Patient presented with abdominal pain, leukocytosis, fever. CT abdomen and pelvis showed diverticulosis of the colon with pericolonic inflammation changes in the mid to distal descending colon consistent with acute diverticulitis, free air in the abdomen likely indicates perforated diverticulitis. -Continue with IV Zosyn -Continue with IV fluids -Appreciate surgery evaluation. NPO, sips with meds WBC trending down.   2-Sepsis secondary to perforated diverticulum IV fluids and IV antibiotics.  Surgery following.   3-Diabetes type 2: CBG low, discontinued sliding scale Monitor CBG  HTN;  Hold Norvasc, due to sepsis.   Acquired hypothyroidism: Continue with Synthroid    Estimated body mass index is 31.74 kg/m as calculated from the following:   Height as of this encounter: 5' 0.24" (1.53 m).   Weight as of this encounter: 74.3 kg.   DVT prophylaxis: Lovenox Code Status: Full code Family Communication: Care discussed with patient.  Disposition Plan:  Status is: Inpatient Remains inpatient appropriate  because: management., perforated diverticulitis.     Consultants:  Surgery   Procedures:  None  Antimicrobials:    Subjective: She report abdominal pain and feels bloated. No Bm. Passing some gas.    Objective: Vitals:   07/02/22 0612 07/02/22 0655 07/02/22 0700 07/02/22 0752  BP: 136/69   (!) 122/53  Pulse: (!) 110 (!) 101  96  Resp: 18   19  Temp: (!) 102.5 F (39.2 C) 99.1 F (37.3 C)  99.5 F (37.5 C)  TempSrc: Oral Oral  Oral  SpO2: (!) 88% 90%  91%  Weight:   74.3 kg   Height:        Intake/Output Summary (Last 24 hours) at 07/02/2022 0756 Last data filed at 07/02/2022 0301 Gross per 24 hour  Intake 1180 ml  Output --  Net 1180 ml   Filed Weights   07/01/22 1616 07/02/22 0700  Weight: 70 kg 74.3 kg    Examination:  General exam: Appears calm and comfortable  Respiratory system: Clear to auscultation. Respiratory effort normal. Cardiovascular system: S1 & S2 heard, RRR. No JVD, murmurs, rubs, gallops or clicks. No pedal edema. Gastrointestinal system: Abdomen soft, tender, positive bowel sounds.  Central nervous system: Alert and oriented. No focal neurological deficits. Extremities: Symmetric 5 x 5 power.   Data Reviewed: I have personally reviewed following labs and imaging studies  CBC: Recent Labs  Lab 07/01/22 1637 07/02/22 0033 07/02/22 0633  WBC 20.7* 19.4* 13.8*  NEUTROABS 17.9* 16.6*  --   HGB 14.4 13.0 13.8  HCT 43.0 39.1 41.9  MCV 90.5 93.3 93.9  PLT 275 259 546   Basic Metabolic Panel: Recent Labs  Lab 07/01/22 1637 07/02/22 0033 07/02/22 0633  NA 139 137 137  K  3.5 3.8 3.6  CL 106 104 107  CO2 20* 21* 18*  GLUCOSE 139* 106* 94  BUN '18 14 12  '$ CREATININE 0.82 0.67 0.68  CALCIUM 9.5 8.7* 8.7*  MG 2.1 2.0  --    GFR: Estimated Creatinine Clearance: 60.9 mL/min (by C-G formula based on SCr of 0.68 mg/dL). Liver Function Tests: Recent Labs  Lab 07/01/22 1637 07/02/22 0033  AST 13* 12*  ALT 19 17  ALKPHOS 59 50   BILITOT 1.0 1.1  PROT 8.0 7.4  ALBUMIN 4.0 3.4*   Recent Labs  Lab 07/01/22 1637  LIPASE 22   No results for input(s): "AMMONIA" in the last 168 hours. Coagulation Profile: Recent Labs  Lab 07/02/22 0033  INR 1.1   Cardiac Enzymes: No results for input(s): "CKTOTAL", "CKMB", "CKMBINDEX", "TROPONINI" in the last 168 hours. BNP (last 3 results) No results for input(s): "PROBNP" in the last 8760 hours. HbA1C: No results for input(s): "HGBA1C" in the last 72 hours. CBG: Recent Labs  Lab 07/02/22 0005 07/02/22 0605 07/02/22 0752  GLUCAP 99 90 85   Lipid Profile: No results for input(s): "CHOL", "HDL", "LDLCALC", "TRIG", "CHOLHDL", "LDLDIRECT" in the last 72 hours. Thyroid Function Tests: No results for input(s): "TSH", "T4TOTAL", "FREET4", "T3FREE", "THYROIDAB" in the last 72 hours. Anemia Panel: No results for input(s): "VITAMINB12", "FOLATE", "FERRITIN", "TIBC", "IRON", "RETICCTPCT" in the last 72 hours. Sepsis Labs: No results for input(s): "PROCALCITON", "LATICACIDVEN" in the last 168 hours.  No results found for this or any previous visit (from the past 240 hour(s)).       Radiology Studies: CT Abdomen Pelvis W Contrast  Result Date: 07/01/2022 CLINICAL DATA:  Bowel obstruction suspected. Abdominal pain, nausea, and vomiting since Monday. EXAM: CT ABDOMEN AND PELVIS WITH CONTRAST TECHNIQUE: Multidetector CT imaging of the abdomen and pelvis was performed using the standard protocol following bolus administration of intravenous contrast. RADIATION DOSE REDUCTION: This exam was performed according to the departmental dose-optimization program which includes automated exposure control, adjustment of the mA and/or kV according to patient size and/or use of iterative reconstruction technique. CONTRAST:  175m OMNIPAQUE IOHEXOL 300 MG/ML  SOLN COMPARISON:  None Available. FINDINGS: Lower chest: Streaky infiltrates in the lung bases, likely linear atelectasis. Small  esophageal hiatal hernia. Hepatobiliary: No focal liver lesions. Vague densities in the gallbladder likely representing small non radiopaque stones. No gallbladder wall thickening. No bile duct dilatation. Pancreas: Unremarkable. No pancreatic ductal dilatation or surrounding inflammatory changes. Spleen: Normal in size without focal abnormality. Adrenals/Urinary Tract: Adrenal glands are unremarkable. Kidneys are normal, without renal calculi, focal lesion, or hydronephrosis. Bladder is unremarkable. Stomach/Bowel: Stomach, small bowel, and colon are not abnormally distended. Fluid-filled nondistended small bowel are present. Diverticulosis of the sigmoid colon with stranding around the mid/distal descending colon. Changes are consistent with acute diverticulitis. Mild wall thickening of adjacent small bowel suggesting reactive inflammation or possibly enteritis. Appendix is normal. Vascular/Lymphatic: Aortic atherosclerosis. No enlarged abdominal or pelvic lymph nodes. Reproductive: Uterus and bilateral adnexa are unremarkable. Other: Diffuse free abdominal air is demonstrated with air extending into the abdominal hiatus. In the absence of recent surgery, this likely represents bowel perforation. Given additional finding of apparent acute diverticulitis, this likely represents a perforated diverticulitis. No free fluid in the abdomen. Abdominal wall musculature appears intact. Musculoskeletal: No acute or significant osseous findings. IMPRESSION: 1. Diverticulosis of the colon with pericolonic inflammatory changes in the mid/distal descending colon consistent with acute diverticulitis. 2. Diffuse free air in the abdomen likely indicates perforated  diverticulitis. 3. Small bowel wall thickening in the left abdomen likely represents reactive inflammation although enteritis could also have this appearance. 4. Cholelithiasis without evidence of acute cholecystitis. 5. Linear atelectasis in the lung bases. Critical  Value/emergent results were called by telephone at the time of interpretation on 07/01/2022 at 7:55 pm to provider Dr. Tyrone Nine, who verbally acknowledged these results. Electronically Signed   By: Lucienne Capers M.D.   On: 07/01/2022 20:02        Scheduled Meds:  amLODipine  5 mg Oral Daily   enoxaparin (LOVENOX) injection  40 mg Subcutaneous Q24H   insulin aspart  0-15 Units Subcutaneous Q4H   insulin aspart  0-6 Units Subcutaneous Q6H   levothyroxine  175 mcg Oral Q0600   Continuous Infusions:  0.9 % NaCl with KCl 20 mEq / L 125 mL/hr at 07/02/22 0649   lactated ringers Stopped (07/01/22 2214)   piperacillin-tazobactam (ZOSYN)  IV 12.5 mL/hr at 07/02/22 0301     LOS: 1 day    Time spent: 35 minutes    Jarelle Ates A Andreina Outten, MD [facility]   If 7PM-7AM, please contact night-coverage [email or web address]  07/02/2022, 7:56 AM

## 2022-07-02 NOTE — Progress Notes (Signed)
   07/02/22 0612  Assess: MEWS Score  Temp (!) 102.5 F (39.2 C)  BP 136/69  MAP (mmHg) 87  Pulse Rate (!) 110  Resp 18  SpO2 (!) 88 %  O2 Device Room Air  Assess: MEWS Score  MEWS Temp 2  MEWS Systolic 0  MEWS Pulse 1  MEWS RR 0  MEWS LOC 0  MEWS Score 3  MEWS Score Color Yellow  Assess: if the MEWS score is Yellow or Red  Were vital signs taken at a resting state? Yes  Focused Assessment No change from prior assessment  Does the patient meet 2 or more of the SIRS criteria? Yes  Does the patient have a confirmed or suspected source of infection? Yes  MEWS guidelines implemented *See Row Information* Yes  Treat  MEWS Interventions Administered prn meds/treatments  Take Vital Signs  Increase Vital Sign Frequency  Yellow: Q 2hr X 2 then Q 4hr X 2, if remains yellow, continue Q 4hrs  Escalate  MEWS: Escalate Yellow: discuss with charge nurse/RN and consider discussing with provider and RRT  Notify: Charge Nurse/RN  Name of Charge Nurse/RN Notified Carmin Richmond, RN  Date Charge Nurse/RN Notified 07/02/22  Time Charge Nurse/RN Notified 0612  Assess: SIRS CRITERIA  SIRS Temperature  1  SIRS Pulse 1  SIRS Respirations  0  SIRS WBC 1  SIRS Score Sum  3

## 2022-07-02 NOTE — Progress Notes (Signed)
  Subjective No acute events. Feeling much better than last night, abdominal pain much improved. No n/v.  Objective: Vital signs in last 24 hours: Temp:  [97.8 F (36.6 C)-102.5 F (39.2 C)] 99.5 F (37.5 C) (07/27 0752) Pulse Rate:  [82-110] 96 (07/27 0752) Resp:  [15-19] 19 (07/27 0752) BP: (96-136)/(50-86) 122/53 (07/27 0752) SpO2:  [88 %-95 %] 91 % (07/27 0752) Weight:  [70 kg-74.3 kg] 74.3 kg (07/27 0700) Last BM Date : 06/29/22 (Pt reports has not been able to eat)  Intake/Output from previous day: 07/26 0701 - 07/27 0700 In: 1180 [I.V.:72.9; IV Piggyback:1107.2] Out: -  Intake/Output this shift: No intake/output data recorded.  Gen: NAD, comfortable CV: RRR Pulm: Normal work of breathing Abd: Soft, not significantly tender, nor distended Ext: SCDs in place  Lab Results: CBC  Recent Labs    07/02/22 0033 07/02/22 0633  WBC 19.4* 13.8*  HGB 13.0 13.8  HCT 39.1 41.9  PLT 259 267   BMET Recent Labs    07/02/22 0033 07/02/22 0633  NA 137 137  K 3.8 3.6  CL 104 107  CO2 21* 18*  GLUCOSE 106* 94  BUN 14 12  CREATININE 0.67 0.68  CALCIUM 8.7* 8.7*   PT/INR Recent Labs    07/02/22 0033  LABPROT 14.5  INR 1.1   ABG No results for input(s): "PHART", "HCO3" in the last 72 hours.  Invalid input(s): "PCO2", "PO2"  Studies/Results:  Anti-infectives: Anti-infectives (From admission, onward)    Start     Dose/Rate Route Frequency Ordered Stop   07/02/22 0645  piperacillin-tazobactam (ZOSYN) IVPB 3.375 g  Status:  Discontinued        3.375 g 12.5 mL/hr over 240 Minutes Intravenous Every 8 hours 07/02/22 0546 07/02/22 0550   07/02/22 0200  piperacillin-tazobactam (ZOSYN) IVPB 3.375 g        3.375 g 12.5 mL/hr over 240 Minutes Intravenous Every 8 hours 07/01/22 2115     07/01/22 2015  piperacillin-tazobactam (ZOSYN) IVPB 3.375 g        3.375 g 100 mL/hr over 30 Minutes Intravenous  Once 07/01/22 2001 07/01/22 2111         Assessment/Plan: Patient Active Problem List   Diagnosis Date Noted   Sepsis ([place]) 07/02/2022   Hypertension    DM2 (diabetes mellitus, type 2) ([place])    Acquired hypothyroidism    Perforated diverticulum 07/01/2022   Diverticulitis of colon with perforation 07/01/2022   Left ankle sprain 06/03/2015    Perforated sigmoid diverticulitis with free air, no free fluid  Clinically much improved Continue sips liquids, IV abx, MIVF We will follow with you Ok for chemical dvt ppx from our perspective   LOS: 1 day   I spent a total of 35 minutes in both face-to-face and non-face-to-face activities, excluding procedures performed, for this visit on the date of this encounter.  Nadeen Landau, [facility], [place]

## 2022-07-02 NOTE — Progress Notes (Signed)
   07/02/22 0655  Assess: MEWS Score  Temp 99.1 F (37.3 C)  Pulse Rate (!) 101  SpO2 90 %  O2 Device Nasal Cannula  O2 Flow Rate (L/min) 1 L/min  Assess: MEWS Score  MEWS Temp 0  MEWS Systolic 0  MEWS Pulse 1  MEWS RR 0  MEWS LOC 0  MEWS Score 1  MEWS Score Color Green  Document  Patient Outcome Stabilized after interventions  Assess: SIRS CRITERIA  SIRS Temperature  0  SIRS Pulse 1  SIRS Respirations  0  SIRS WBC 1  SIRS Score Sum  2

## 2022-07-02 NOTE — H&P (Signed)
History and Physical    PLEASE NOTE THAT DRAGON DICTATION SOFTWARE WAS USED IN THE CONSTRUCTION OF THIS NOTE.   Morning Halberg Chovan PXT:[id number] DOB: Mar 31, 1951 DOA: 07/01/2022  PCP: Pcp, No (will further assess) Patient coming from: home   I have personally briefly reviewed patient's old medical records in [place]  Chief Complaint: Abdominal pain  HPI: Heather Vang is a 71 y.o. female with medical history significant for type 2 diabetes mellitus, essential hypertension, acquired hypothyroidism, who is admitted to [facility] on 07/01/2022 with perforated diverticulum after presenting from home to [facility] ED complaining of abdominal pain.   Patient reports to 3 days of progressive generalized abdominal discomfort associate with nausea in the absence of any vomiting.  She also denies any recent diarrhea, melena, or hematochezia.  No associated any subjective fever, chills, rigors, or generalized myalgias.  No recent trauma.  Denies any recent dysuria, gross materia, or change in urinary urgency/frequency.  No rash.  No shortness of breath/cough.  No associated any recent chest pain or palpitations, diaphoresis, dizziness, presyncope, or syncope.  Not on any blood thinners as an outpatient, including no aspirin.     ED Course:  Vital signs in the ED were notable for the following: Afebrile; heart rate 85-97; initial blood pressure 96/80, with ensuing increased to 105/50 following interval IV fluids, as below; respiratory rate 15-17, oxygen saturation 94 to 95% room air.  Labs were notable for the following: CMP notable for the following: Creatinine 0.82, glucose 139, liver enzymes within normal limits.  Lipase 22.  CBC notable for white blood cell count 20,700 with 87% neutrophils.  Urinalysis showed 11-20 white blood cells, rare bacteria, nitrate negative.  Imaging and additional notable ED work-up: CT abdomen/pelvis showed evidence of perforated  diverticulum.  ED PD discussed patient's case with on-call general surgeon, Dr. Ninfa Linden, Who will formally consult and see the patient's evening.  While in the ED, the following were administered: Morphine 4 mg IV x1, Zofran 4 mg IV x1, Zosyn, symptoms 1 L bolus.  Subsequently, the patient was admitted for further evaluation management of presenting perforated diverticulum associated with sepsis.    Review of Systems: As per HPI otherwise 10 point review of systems negative.   Past Medical History:  Diagnosis Date   Anxiety    Diabetes mellitus without complication ([place])    Hypertension    Thyroid disease     Past Surgical History:  Procedure Laterality Date   COSMETIC SURGERY     stomach    GALLBLADDER SURGERY  03/07/2014   SHOULDER SURGERY  06/06/2014    Social History:  reports that she has never smoked. She does not have any smokeless tobacco history on file. She reports that she does not drink alcohol and does not use drugs.   No Known Allergies  Family History  Problem Relation Age of Onset   Diabetes Mother     Family history reviewed and not pertinent    Prior to Admission medications   Medication Sig Start Date End Date Taking? Authorizing Provider  amLODipine (NORVASC) 5 MG tablet Take 5 mg by mouth daily.   Yes [provider]  estazolam (PROSOM) 2 MG tablet Take 2 mg by mouth at bedtime.   Yes [provider]  levothyroxine (SYNTHROID) 125 MCG tablet Take 125 mcg by mouth daily before breakfast.   Yes [provider]  naproxen sodium (ANAPROX DS) 550 MG tablet Take 1 tablet (550 mg total) by mouth  2 (two) times daily with a meal. Patient not taking: Reported on 07/01/2022 06/03/15   Jaynee Eagles, PA-C     Objective    Physical Exam: Vitals:   07/01/22 2153 07/01/22 2231 07/02/22 0224 07/02/22 0612  BP:  119/67 130/67 136/69  Pulse:  90 96 (!) 110  Resp:  _0 Temp: 98.2 F (36.8 C) 98.6 F (37 C) 100 F (37.8 C) (!)  102.5 F (39.2 C)  TempSrc: Oral Oral Oral Oral  SpO2:  94% 91% (!) 88%  Weight:      Height:        General: appears to be stated age; alert, oriented Skin: warm, dry, no rash Head:  AT/[place] Mouth:  Oral mucosa membranes appear dry, normal dentition Neck: supple; trachea midline Heart:  RRR; did not appreciate any M/R/G Lungs: CTAB, did not appreciate any wheezes, rales, or rhonchi Abdomen: + BS; soft, ND, tenderness to palpation times all 4 quadrants, in the absence of any associated guarding, rigidity, or rebound tenderness. Vascular: 2+ pedal pulses b/l; 2+ radial pulses b/l Extremities: no peripheral edema, no muscle wasting Neuro: strength and sensation intact in upper and lower extremities b/l      Labs on Admission: I have personally reviewed following labs and imaging studies  CBC: Recent Labs  Lab 07/01/22 1637 07/02/22 0033 07/02/22 0633  WBC 20.7* 19.4* 13.8*  NEUTROABS 17.9* 16.6*  --   HGB 14.4 13.0 13.8  HCT 43.0 39.1 41.9  MCV 90.5 93.3 93.9  PLT 275 259 712   Basic Metabolic Panel: Recent Labs  Lab 07/01/22 1637 07/02/22 0033  NA 139 137  K 3.5 3.8  CL 106 104  CO2 20* 21*  GLUCOSE 139* 106*  BUN 18 14  CREATININE 0.82 0.67  CALCIUM 9.5 8.7*  MG 2.1 2.0   GFR: Estimated Creatinine Clearance: 59.1 mL/min (by C-G formula based on SCr of 0.67 mg/dL). Liver Function Tests: Recent Labs  Lab 07/01/22 1637 07/02/22 0033  AST 13* 12*  ALT 19 17  ALKPHOS 59 50  BILITOT 1.0 1.1  PROT 8.0 7.4  ALBUMIN 4.0 3.4*   Recent Labs  Lab 07/01/22 1637  LIPASE 22   No results for input(s): "AMMONIA" in the last 168 hours. Coagulation Profile: Recent Labs  Lab 07/02/22 0033  INR 1.1   Cardiac Enzymes: No results for input(s): "CKTOTAL", "CKMB", "CKMBINDEX", "TROPONINI" in the last 168 hours. BNP (last 3 results) No results for input(s): "PROBNP" in the last 8760 hours. HbA1C: No results for input(s): "HGBA1C" in the last 72  hours. CBG: Recent Labs  Lab 07/02/22 0005 07/02/22 0605  GLUCAP 99 90   Lipid Profile: No results for input(s): "CHOL", "HDL", "LDLCALC", "TRIG", "CHOLHDL", "LDLDIRECT" in the last 72 hours. Thyroid Function Tests: No results for input(s): "TSH", "T4TOTAL", "FREET4", "T3FREE", "THYROIDAB" in the last 72 hours. Anemia Panel: No results for input(s): "VITAMINB12", "FOLATE", "FERRITIN", "TIBC", "IRON", "RETICCTPCT" in the last 72 hours. Urine analysis:    Component Value Date/Time   COLORURINE YELLOW 07/01/2022 1637   APPEARANCEUR CLEAR 07/01/2022 1637   LABSPEC 1.031 (H) 07/01/2022 1637   PHURINE 6.0 07/01/2022 1637   GLUCOSEU >=500 (A) 07/01/2022 1637   HGBUR NEGATIVE 07/01/2022 1637   BILIRUBINUR NEGATIVE 07/01/2022 1637   KETONESUR 20 (A) 07/01/2022 1637   PROTEINUR 30 (A) 07/01/2022 1637   NITRITE NEGATIVE 07/01/2022 1637   LEUKOCYTESUR SMALL (A) 07/01/2022 1637    Radiological Exams on Admission: CT Abdomen Pelvis W Contrast  Result Date: 07/01/2022 CLINICAL DATA:  Bowel obstruction suspected. Abdominal pain, nausea, and vomiting since Monday. EXAM: CT ABDOMEN AND PELVIS WITH CONTRAST TECHNIQUE: Multidetector CT imaging of the abdomen and pelvis was performed using the standard protocol following bolus administration of intravenous contrast. RADIATION DOSE REDUCTION: This exam was performed according to the departmental dose-optimization program which includes automated exposure control, adjustment of the mA and/or kV according to patient size and/or use of iterative reconstruction technique. CONTRAST:  158m OMNIPAQUE IOHEXOL 300 MG/ML  SOLN COMPARISON:  None Available. FINDINGS: Lower chest: Streaky infiltrates in the lung bases, likely linear atelectasis. Small esophageal hiatal hernia. Hepatobiliary: No focal liver lesions. Vague densities in the gallbladder likely representing small non radiopaque stones. No gallbladder wall thickening. No bile duct dilatation. Pancreas:  Unremarkable. No pancreatic ductal dilatation or surrounding inflammatory changes. Spleen: Normal in size without focal abnormality. Adrenals/Urinary Tract: Adrenal glands are unremarkable. Kidneys are normal, without renal calculi, focal lesion, or hydronephrosis. Bladder is unremarkable. Stomach/Bowel: Stomach, small bowel, and colon are not abnormally distended. Fluid-filled nondistended small bowel are present. Diverticulosis of the sigmoid colon with stranding around the mid/distal descending colon. Changes are consistent with acute diverticulitis. Mild wall thickening of adjacent small bowel suggesting reactive inflammation or possibly enteritis. Appendix is normal. Vascular/Lymphatic: Aortic atherosclerosis. No enlarged abdominal or pelvic lymph nodes. Reproductive: Uterus and bilateral adnexa are unremarkable. Other: Diffuse free abdominal air is demonstrated with air extending into the abdominal hiatus. In the absence of recent surgery, this likely represents bowel perforation. Given additional finding of apparent acute diverticulitis, this likely represents a perforated diverticulitis. No free fluid in the abdomen. Abdominal wall musculature appears intact. Musculoskeletal: No acute or significant osseous findings. IMPRESSION: 1. Diverticulosis of the colon with pericolonic inflammatory changes in the mid/distal descending colon consistent with acute diverticulitis. 2. Diffuse free air in the abdomen likely indicates perforated diverticulitis. 3. Small bowel wall thickening in the left abdomen likely represents reactive inflammation although enteritis could also have this appearance. 4. Cholelithiasis without evidence of acute cholecystitis. 5. Linear atelectasis in the lung bases. Critical Value/emergent results were called by telephone at the time of interpretation on 07/01/2022 at 7:55 pm to provider Dr. FTyrone Nine who verbally acknowledged these results. Electronically Signed   By: WLucienne CapersM.D.    On: 07/01/2022 20:02      Assessment/Plan    Principal Problem:   Perforated diverticulum Active Problems:   Diverticulitis of colon with perforation   Sepsis ([place]   Hypertension   DM2 (diabetes mellitus, type 2) [place]   Acquired hypothyroidism        #) Perforated diverticulum: In the setting of 2 3 days progressive abdominal discomfort, CT abdomen/will shows evidence of perforated diverticulum.  Appears associated with sepsis, as further detailed below, but in the absence of any evidence of hemodynamic instability.  General surgery consulted.   Plan: NPO.  Continuous IV fluids.  As needed IV fentanyl.  Prn IV Zofran.  General surgery consulted.  Check INR.  Repeat CMP and CBC.  Zosyn.         #) Sepsis due to perforated diverticulum:  SIRS criteria met via leukocytosis, tachycardia. Of note, in the absence of any evidence of end organ damage,  Although lactic acid level currently pending,  pt's sepsis does not meet criteria to be considered severe in nature. Also, in the absence of LA level greater than or equal to 4.0, and in the absence of any associated hypotension refractory to IVF's, there are no  indications for administration of a 30 mL/kg IVF bolus at this time.   Additional ED work-up/management notable for: Urinalysis but does not meet quantitative threshold for significance in terms of pyuria and a female.  Concomitant with no acute urinary symptoms, presentation appears less suggestive of underlying UTI.  No acute respiratory symptoms to warrant further evaluation via chest x-ray at this time.    Plan: CBC w/ diff and CMP in AM. Abx: Continue Zosyn.  Continuous IV fluids.  Lactic acid level.  Further evaluation management of presenting perforated diverticulum, including general surgery consultation, as above.          #) Type 2 Diabetes Mellitus: documented history of such. Home insulin regimen: None. Home oral hypoglycemic agents: None.  Rather, the  patient's diabetes is managed via lifestyle modifications at home.  Presenting blood sugar: 139.    Plan: In the setting of current n.p.o. status, will pursue Accu-Cheks on every 6 hours basis with low-dose sliding scale insulin.              #) Essential Hypertension: documented h/o such, with outpatient antihypertensive regimen including Norvasc.  SBP's in the ED today: In the 90s to low 100s mmHg. in the setting of presenting sepsis due to perforated diverticulum, will hold home amlodipine for now.  Plan: Close monitoring of subsequent BP via routine VS. hold home amlodipine for now, as above.             #) acquired hypothyroidism: documented h/o such, on Synthroid as outpatient.   Plan: In the setting of current n.p.o. status, will hold home Synthroid for now.            DVT prophylaxis: SCD's   Code Status: Full code Family Communication: none Disposition Plan: Per Rounding Team Consults called: EDP d\w on-call general surgery, Dr. Ninfa Linden, as above;  Admission status: inpatient   PLEASE NOTE THAT DRAGON DICTATION SOFTWARE WAS USED IN THE CONSTRUCTION OF THIS NOTE.   [place] DO [facility] [place]   07/02/2022, 6:48 AM

## 2022-07-02 NOTE — TOC Progression Note (Signed)
Transition of Care [facility] - Progression Note    Patient Details  Name: Heather Vang MRN: [id number] Date of Birth: 1951/08/04  Transition of Care [facility]) CM/SW Contact  Servando Snare, [place] Phone Number: 07/02/2022, 9:54 AM  Clinical Narrative:     .toc       Expected Discharge Plan and Services                                                 Social Determinants of Health (SDOH) Interventions    Readmission Risk Interventions     No data to display

## 2022-07-03 LAB — CBC
HCT: 39.6 % (ref 36.0–46.0)
Hemoglobin: 13.5 g/dL (ref 12.0–15.0)
MCH: 31.2 pg (ref 26.0–34.0)
MCHC: 34.1 g/dL (ref 30.0–36.0)
MCV: 91.5 fL (ref 80.0–100.0)
Platelets: 305 10*3/uL (ref 150–400)
RBC: 4.33 MIL/uL (ref 3.87–5.11)
RDW: 14.1 % (ref 11.5–15.5)
WBC: 12.5 10*3/uL — ABNORMAL HIGH (ref 4.0–10.5)
nRBC: 0 % (ref 0.0–0.2)

## 2022-07-03 LAB — BASIC METABOLIC PANEL
Anion gap: 13 (ref 5–15)
BUN: 8 mg/dL (ref 8–23)
CO2: 17 mmol/L — ABNORMAL LOW (ref 22–32)
Calcium: 8.6 mg/dL — ABNORMAL LOW (ref 8.9–10.3)
Chloride: 104 mmol/L (ref 98–111)
Creatinine, Ser: 0.49 mg/dL (ref 0.44–1.00)
GFR, Estimated: >60 mL/min (ref >60)
Glucose, Bld: 141 mg/dL — ABNORMAL HIGH (ref 70–99)
Potassium: 3.4 mmol/L — ABNORMAL LOW (ref 3.5–5.1)
Sodium: 134 mmol/L — ABNORMAL LOW (ref 135–145)

## 2022-07-03 LAB — GLUCOSE, CAPILLARY
Glucose-Capillary: 147 mg/dL — ABNORMAL HIGH (ref 70–99)
Glucose-Capillary: 136 mg/dL — ABNORMAL HIGH (ref 70–99)
Glucose-Capillary: 151 mg/dL — ABNORMAL HIGH (ref 70–99)
Glucose-Capillary: 115 mg/dL — ABNORMAL HIGH (ref 70–99)

## 2022-07-03 LAB — MAGNESIUM: Magnesium: 1.8 mg/dL (ref 1.7–2.4)

## 2022-07-03 MED ORDER — DICLOFENAC SODIUM 1 % EX GEL
2.0000 g | Freq: Four times a day (QID) | CUTANEOUS | Status: DC
  Administered 2022-07-03 – 2022-07-14 (×31): 2 g via TOPICAL
  Filled 2022-07-03: qty 100

## 2022-07-03 NOTE — Progress Notes (Signed)
PROGRESS NOTE    Heather Vang  DPO:[id number] DOB: Feb 07, 1951 DOA: 07/01/2022 PCP: Pcp, No   Brief Narrative: 71 year old with past medical history significant for diabetes type 2, hypertension, acquired hypothyroidism who presented complaining of abdominal pain she was found to have perforated diverticulum.  She was found to have leukocytosis white blood cell at 20, CT abdomen and pelvis show diverticulosis of the colon with pericolonic inflammatory changes in the mid to distal descending colon consistent with acute diverticulitis.  Diffuse free air in the abdomen likely indicate perforated diverticulitis.   Assessment & Plan:   Principal Problem:   Perforated diverticulum Active Problems:   Diverticulitis of colon with perforation   Sepsis ([place])   Hypertension   DM2 (diabetes mellitus, type 2) ([place])   Acquired hypothyroidism  1-Perforated diverticulitis: -Patient presented with abdominal pain, leukocytosis, fever. -CT abdomen and pelvis showed diverticulosis of the colon with pericolonic inflammation changes in the mid to distal descending colon consistent with acute diverticulitis, free air in the abdomen likely indicates perforated diverticulitis. -Continue with IV Zosyn. -Continue with IV fluids. -Appreciate surgery evaluation. -plan to start clear liquid diet.  -WBC trending down.   2-Sepsis secondary to perforated diverticulum IV fluids and IV antibiotics.  Surgery following.   3-Diabetes type 2: CBG low, discontinued sliding scale Monitor CBG  HTN;  Hold Norvasc, due to sepsis.   Acquired hypothyroidism: Continue with Synthroid    Estimated body mass index is 32.21 kg/m as calculated from the following:   Height as of this encounter: 5' 0.24" (1.53 m).   Weight as of this encounter: 75.4 kg.   DVT prophylaxis: Lovenox Code Status: Full code Family Communication: Care discussed with patient.  Disposition Plan:  Status is: Inpatient Remains  inpatient appropriate because: management., perforated diverticulitis.     Consultants:  Surgery   Procedures:  None  Antimicrobials:    Subjective: Abdominal pain is the same than yesterday, worse with movement.  Denies nausea. She would like to try clear   Objective: Vitals:   07/02/22 1420 07/02/22 1813 07/02/22 2247 07/03/22 0531  BP: 121/65 130/63 (!) 115/47 125/66  Pulse: 90 90 93 92  Resp: '16 18 16 18  '$ Temp: 98.4 F (36.9 C) 98.2 F (36.8 C) 99.1 F (37.3 C) (!) 97.3 F (36.3 C)  TempSrc: Oral Oral Oral Oral  SpO2: 93% 93% 92% 91%  Weight:    75.4 kg  Height:        Intake/Output Summary (Last 24 hours) at 07/03/2022 1255 Last data filed at 07/03/2022 0630 Gross per 24 hour  Intake 2392.27 ml  Output --  Net 2392.27 ml    Filed Weights   07/01/22 1616 07/02/22 0700 07/03/22 0531  Weight: 70 kg 74.3 kg 75.4 kg    Examination:  General exam: NAD Respiratory system: CTA Cardiovascular system: S 1, S 2 RRR Gastrointestinal system: BS present, soft, nt Central nervous system: non focal.  Extremities: Symmetric power   Data Reviewed: I have personally reviewed following labs and imaging studies  CBC: Recent Labs  Lab 07/01/22 1637 07/02/22 0033 07/02/22 0633 07/03/22 0811  WBC 20.7* 19.4* 13.8* 12.5*  NEUTROABS 17.9* 16.6*  --   --   HGB 14.4 13.0 13.8 13.5  HCT 43.0 39.1 41.9 39.6  MCV 90.5 93.3 93.9 91.5  PLT 275 259 267 431    Basic Metabolic Panel: Recent Labs  Lab 07/01/22 1637 07/02/22 0033 07/02/22 0633  NA 139 137 137  K 3.5 3.8 3.6  CL 106 104 107  CO2 20* 21* 18*  GLUCOSE 139* 106* 94  BUN '18 14 12  '$ CREATININE 0.82 0.67 0.68  CALCIUM 9.5 8.7* 8.7*  MG 2.1 2.0  --     GFR: Estimated Creatinine Clearance: 61.4 mL/min (by C-G formula based on SCr of 0.68 mg/dL). Liver Function Tests: Recent Labs  Lab 07/01/22 1637 07/02/22 0033  AST 13* 12*  ALT 19 17  ALKPHOS 59 50  BILITOT 1.0 1.1  PROT 8.0 7.4  ALBUMIN 4.0  3.4*    Recent Labs  Lab 07/01/22 1637  LIPASE 22    No results for input(s): "AMMONIA" in the last 168 hours. Coagulation Profile: Recent Labs  Lab 07/02/22 0033  INR 1.1    Cardiac Enzymes: No results for input(s): "CKTOTAL", "CKMB", "CKMBINDEX", "TROPONINI" in the last 168 hours. BNP (last 3 results) No results for input(s): "PROBNP" in the last 8760 hours. HbA1C: Recent Labs    07/02/22 0633  HGBA1C 6.3*   CBG: Recent Labs  Lab 07/02/22 1425 07/02/22 1656 07/02/22 2250 07/03/22 0725 07/03/22 1142  GLUCAP 80 77 109* 147* 136*    Lipid Profile: No results for input(s): "CHOL", "HDL", "LDLCALC", "TRIG", "CHOLHDL", "LDLDIRECT" in the last 72 hours. Thyroid Function Tests: No results for input(s): "TSH", "T4TOTAL", "FREET4", "T3FREE", "THYROIDAB" in the last 72 hours. Anemia Panel: No results for input(s): "VITAMINB12", "FOLATE", "FERRITIN", "TIBC", "IRON", "RETICCTPCT" in the last 72 hours. Sepsis Labs: Recent Labs  Lab 07/02/22 0739  LATICACIDVEN 1.5    No results found for this or any previous visit (from the past 240 hour(s)).       Radiology Studies: CT Abdomen Pelvis W Contrast  Result Date: 07/01/2022 CLINICAL DATA:  Bowel obstruction suspected. Abdominal pain, nausea, and vomiting since Monday. EXAM: CT ABDOMEN AND PELVIS WITH CONTRAST TECHNIQUE: Multidetector CT imaging of the abdomen and pelvis was performed using the standard protocol following bolus administration of intravenous contrast. RADIATION DOSE REDUCTION: This exam was performed according to the departmental dose-optimization program which includes automated exposure control, adjustment of the mA and/or kV according to patient size and/or use of iterative reconstruction technique. CONTRAST:  134m OMNIPAQUE IOHEXOL 300 MG/ML  SOLN COMPARISON:  None Available. FINDINGS: Lower chest: Streaky infiltrates in the lung bases, likely linear atelectasis. Small esophageal hiatal hernia.  Hepatobiliary: No focal liver lesions. Vague densities in the gallbladder likely representing small non radiopaque stones. No gallbladder wall thickening. No bile duct dilatation. Pancreas: Unremarkable. No pancreatic ductal dilatation or surrounding inflammatory changes. Spleen: Normal in size without focal abnormality. Adrenals/Urinary Tract: Adrenal glands are unremarkable. Kidneys are normal, without renal calculi, focal lesion, or hydronephrosis. Bladder is unremarkable. Stomach/Bowel: Stomach, small bowel, and colon are not abnormally distended. Fluid-filled nondistended small bowel are present. Diverticulosis of the sigmoid colon with stranding around the mid/distal descending colon. Changes are consistent with acute diverticulitis. Mild wall thickening of adjacent small bowel suggesting reactive inflammation or possibly enteritis. Appendix is normal. Vascular/Lymphatic: Aortic atherosclerosis. No enlarged abdominal or pelvic lymph nodes. Reproductive: Uterus and bilateral adnexa are unremarkable. Other: Diffuse free abdominal air is demonstrated with air extending into the abdominal hiatus. In the absence of recent surgery, this likely represents bowel perforation. Given additional finding of apparent acute diverticulitis, this likely represents a perforated diverticulitis. No free fluid in the abdomen. Abdominal wall musculature appears intact. Musculoskeletal: No acute or significant osseous findings. IMPRESSION: 1. Diverticulosis of the colon with pericolonic inflammatory changes in the mid/distal descending colon consistent with acute diverticulitis. 2.  Diffuse free air in the abdomen likely indicates perforated diverticulitis. 3. Small bowel wall thickening in the left abdomen likely represents reactive inflammation although enteritis could also have this appearance. 4. Cholelithiasis without evidence of acute cholecystitis. 5. Linear atelectasis in the lung bases. Critical Value/emergent results were  called by telephone at the time of interpretation on 07/01/2022 at 7:55 pm to provider Dr. Tyrone Nine, who verbally acknowledged these results. Electronically Signed   By: Lucienne Capers M.D.   On: 07/01/2022 20:02        Scheduled Meds:  diclofenac Sodium  2 g Topical QID   enoxaparin (LOVENOX) injection  40 mg Subcutaneous Q24H   levothyroxine  175 mcg Oral Q0600   Continuous Infusions:  dextrose 5% lactated ringers 100 mL/hr at 07/03/22 0630   lactated ringers Stopped (07/01/22 2214)   piperacillin-tazobactam (ZOSYN)  IV 3.375 g (07/03/22 0900)     LOS: 2 days    Time spent: 35 minutes    Gladys Deckard A Takuya Lariccia, MD [facility]   If 7PM-7AM, please contact night-coverage [email or web address]  07/03/2022, 12:55 PM

## 2022-07-03 NOTE — Progress Notes (Signed)
  Subjective No acute events. Feeling about the same as yesterday, better than admission.  No n/v.  Objective: Vital signs in last 24 hours: Temp:  [97.3 F (36.3 C)-99.1 F (37.3 C)] 97.3 F (36.3 C) (07/28 0531) Pulse Rate:  [82-93] 92 (07/28 0531) Resp:  [16-20] 18 (07/28 0531) BP: (106-130)/(47-66) 125/66 (07/28 0531) SpO2:  [91 %-94 %] 91 % (07/28 0531) Weight:  [75.4 kg] 75.4 kg (07/28 0531) Last BM Date : 06/29/22  Intake/Output from previous day: 07/27 0701 - 07/28 0700 In: 2392.3 [I.V.:2199.5; IV Piggyback:192.8] Out: -  Intake/Output this shift: No intake/output data recorded.  Gen: NAD, comfortable CV: RRR Pulm: Normal work of breathing Abd: Soft, not significantly tender, nor distended Ext: SCDs in place  Lab Results: CBC  Recent Labs    07/02/22 0033 07/02/22 0633  WBC 19.4* 13.8*  HGB 13.0 13.8  HCT 39.1 41.9  PLT 259 267   BMET Recent Labs    07/02/22 0033 07/02/22 0633  NA 137 137  K 3.8 3.6  CL 104 107  CO2 21* 18*  GLUCOSE 106* 94  BUN 14 12  CREATININE 0.67 0.68  CALCIUM 8.7* 8.7*   PT/INR Recent Labs    07/02/22 0033  LABPROT 14.5  INR 1.1   ABG No results for input(s): "PHART", "HCO3" in the last 72 hours.  Invalid input(s): "PCO2", "PO2"  Studies/Results:  Anti-infectives: Anti-infectives (From admission, onward)    Start     Dose/Rate Route Frequency Ordered Stop   07/02/22 0645  piperacillin-tazobactam (ZOSYN) IVPB 3.375 g  Status:  Discontinued        3.375 g 12.5 mL/hr over 240 Minutes Intravenous Every 8 hours 07/02/22 0546 07/02/22 0550   07/02/22 0200  piperacillin-tazobactam (ZOSYN) IVPB 3.375 g        3.375 g 12.5 mL/hr over 240 Minutes Intravenous Every 8 hours 07/01/22 2115     07/01/22 2015  piperacillin-tazobactam (ZOSYN) IVPB 3.375 g        3.375 g 100 mL/hr over 30 Minutes Intravenous  Once 07/01/22 2001 07/01/22 2111        Assessment/Plan: Patient Active Problem List   Diagnosis Date Noted    Sepsis ([place]) 07/02/2022   Hypertension    DM2 (diabetes mellitus, type 2) ([place])    Acquired hypothyroidism    Perforated diverticulum 07/01/2022   Diverticulitis of colon with perforation 07/01/2022   Left ankle sprain 06/03/2015    Perforated sigmoid diverticulitis with free air, no free fluid  Clinically doing well Ok for clear liquids, gently advancing as tolerated Continue IV abx We will follow with you Ok for chemical dvt ppx from our perspective   LOS: 2 days  I spent a total of 38 minutes in both face-to-face and non-face-to-face activities, excluding procedures performed, for this visit on the date of this encounter.   Nadeen Landau, [facility], [place]

## 2022-07-04 LAB — BASIC METABOLIC PANEL
Anion gap: 15 (ref 5–15)
BUN: 8 mg/dL (ref 8–23)
CO2: 19 mmol/L — ABNORMAL LOW (ref 22–32)
Calcium: 8.5 mg/dL — ABNORMAL LOW (ref 8.9–10.3)
Chloride: 103 mmol/L (ref 98–111)
Creatinine, Ser: 0.43 mg/dL — ABNORMAL LOW (ref 0.44–1.00)
GFR, Estimated: >60 mL/min (ref >60)
Glucose, Bld: 113 mg/dL — ABNORMAL HIGH (ref 70–99)
Potassium: 3 mmol/L — ABNORMAL LOW (ref 3.5–5.1)
Sodium: 137 mmol/L (ref 135–145)

## 2022-07-04 LAB — CBC
HCT: 38.6 % (ref 36.0–46.0)
Hemoglobin: 12.9 g/dL (ref 12.0–15.0)
MCH: 30.2 pg (ref 26.0–34.0)
MCHC: 33.4 g/dL (ref 30.0–36.0)
MCV: 90.4 fL (ref 80.0–100.0)
Platelets: 330 10*3/uL (ref 150–400)
RBC: 4.27 MIL/uL (ref 3.87–5.11)
RDW: 14.3 % (ref 11.5–15.5)
WBC: 14.2 10*3/uL — ABNORMAL HIGH (ref 4.0–10.5)
nRBC: 0 % (ref 0.0–0.2)

## 2022-07-04 LAB — GLUCOSE, CAPILLARY
Glucose-Capillary: 132 mg/dL — ABNORMAL HIGH (ref 70–99)
Glucose-Capillary: 146 mg/dL — ABNORMAL HIGH (ref 70–99)
Glucose-Capillary: 140 mg/dL — ABNORMAL HIGH (ref 70–99)

## 2022-07-04 MED ORDER — POTASSIUM CHLORIDE CRYS ER 20 MEQ PO TBCR
40.0000 meq | EXTENDED_RELEASE_TABLET | Freq: Once | ORAL | Status: AC
  Administered 2022-07-04: 40 meq via ORAL
  Filled 2022-07-04: qty 2

## 2022-07-04 NOTE — Progress Notes (Signed)
   Subjective/Chief Complaint: Optometrist used for communication.  Patient feels much better.  Less abdominal pain today.   Objective: Vital signs in last 24 hours: Temp:  [98.2 F (36.8 C)-99.8 F (37.7 C)] 99.8 F (37.7 C) (07/29 0655) Pulse Rate:  [87-92] 87 (07/29 0655) Resp:  [18-20] 20 (07/28 2116) BP: (127-134)/(64-72) 129/70 (07/29 0655) SpO2:  [90 %-94 %] 90 % (07/29 0655) Weight:  [76 kg] 76 kg (07/29 0656) Last BM Date : 07/03/22  Intake/Output from previous day: 07/28 0701 - 07/29 0700 In: 1114.9 [I.V.:1000; IV Piggyback:114.9] Out: -  Intake/Output this shift: No intake/output data recorded.   Gen: NAD, comfortable CV: RRR Pulm: Normal work of breathing Abd: Soft, not significantly tender, nor distended Ext: SCDs in place Lab Results:  Recent Labs    07/03/22 0811 07/04/22 0617  WBC 12.5* 14.2*  HGB 13.5 12.9  HCT 39.6 38.6  PLT 305 330   BMET Recent Labs    07/03/22 0811 07/04/22 0617  NA 134* 137  K 3.4* 3.0*  CL 104 103  CO2 17* 19*  GLUCOSE 141* 113*  BUN 8 8  CREATININE 0.49 0.43*  CALCIUM 8.6* 8.5*   PT/INR Recent Labs    07/02/22 0033  LABPROT 14.5  INR 1.1   ABG No results for input(s): "PHART", "HCO3" in the last 72 hours.  Invalid input(s): "PCO2", "PO2"  Studies/Results: No results found.  Anti-infectives: Anti-infectives (From admission, onward)    Start     Dose/Rate Route Frequency Ordered Stop   07/02/22 0645  piperacillin-tazobactam (ZOSYN) IVPB 3.375 g  Status:  Discontinued        3.375 g 12.5 mL/hr over 240 Minutes Intravenous Every 8 hours 07/02/22 0546 07/02/22 0550   07/02/22 0200  piperacillin-tazobactam (ZOSYN) IVPB 3.375 g        3.375 g 12.5 mL/hr over 240 Minutes Intravenous Every 8 hours 07/01/22 2115     07/01/22 2015  piperacillin-tazobactam (ZOSYN) IVPB 3.375 g        3.375 g 100 mL/hr over 30 Minutes Intravenous  Once 07/01/22 2001 07/01/22 2111       Assessment/Plan:  Sepsis  ([place]) 07/02/2022   Hypertension     DM2 (diabetes mellitus, type 2) ([place])     Acquired hypothyroidism     Perforated diverticulum 07/01/2022   Diverticulitis of colon with perforation 07/01/2022   Left ankle sprain 06/03/2015      Perforated sigmoid diverticulitis with free air, no free fluid   Clinically doing well Ok advancing as tolerated Continue IV abx We will follow with you Ok for chemical dvt ppx from our perspective  Recheck CBC in a.m. due to rising white count.  May need CT scan to exclude abscess depending on labs.  If white count normalizes, may be discharged home tomorrow with 7 days of Augmentin to complete 10-day course.   Total time 30 minutes face-to-face, non-face-to-face, documentation, chart review, medication review, review of imaging studies and vitals   LOS: 3 days    Heather Vang 07/04/2022

## 2022-07-04 NOTE — Progress Notes (Signed)
PROGRESS NOTE    Heather Vang  WJX:[id number] DOB: 09/11/1951 DOA: 07/01/2022 PCP: Pcp, No   Brief Narrative: 71 year old with past medical history significant for diabetes type 2, hypertension, acquired hypothyroidism who presented complaining of abdominal pain she was found to have perforated diverticulum.  She was found to have leukocytosis white blood cell at 20, CT abdomen and pelvis show diverticulosis of the colon with pericolonic inflammatory changes in the mid to distal descending colon consistent with acute diverticulitis.  Diffuse free air in the abdomen likely indicate perforated diverticulitis.   Assessment & Plan:   Principal Problem:   Perforated diverticulum Active Problems:   Diverticulitis of colon with perforation   Sepsis ([place])   Hypertension   DM2 (diabetes mellitus, type 2) ([place])   Acquired hypothyroidism  1-Perforated diverticulitis: -Patient presented with abdominal pain, leukocytosis, fever. -CT abdomen and pelvis showed diverticulosis of the colon with pericolonic inflammation changes in the mid to distal descending colon consistent with acute diverticulitis, free air in the abdomen likely indicates perforated diverticulitis. -Continue with IV Zosyn. -Continue with IV fluids. -Appreciate surgery evaluation. -WBC mildly increase today.  Clinically improved, abdominal pain better. Plan to advance diet.   2-Sepsis secondary to perforated diverticulum Continue with IV fluids and IV antibiotics.  Surgery following.   3-Diabetes type 2: CBG low, discontinued sliding scale Monitor CBG.  HTN;  Hold Norvasc, due to sepsis.   Acquired hypothyroidism: Continue with Synthroid    Estimated body mass index is 32.47 kg/m as calculated from the following:   Height as of this encounter: 5' 0.24" (1.53 m).   Weight as of this encounter: 76 kg.   DVT prophylaxis: Lovenox Code Status: Full code Family Communication: Care discussed with patient.   Disposition Plan:  Status is: Inpatient Remains inpatient appropriate because: management., perforated diverticulitis.     Consultants:  Surgery   Procedures:  None  Antimicrobials:    Subjective: She is feeling better, abdominal pain improved.  She is visiting here from [place].    Objective: Vitals:   07/03/22 2116 07/04/22 0655 07/04/22 0656 07/04/22 1214  BP: 134/64 129/70  (!) 127/59  Pulse: 89 87  79  Resp: 20   20  Temp: 99.7 F (37.6 C) 99.8 F (37.7 C)  98.3 F (36.8 C)  TempSrc: Oral Oral  Oral  SpO2: 90% 90%  94%  Weight:   76 kg   Height:        Intake/Output Summary (Last 24 hours) at 07/04/2022 1539 Last data filed at 07/04/2022 0306 Gross per 24 hour  Intake 1114.85 ml  Output --  Net 1114.85 ml    Filed Weights   07/02/22 0700 07/03/22 0531 07/04/22 0656  Weight: 74.3 kg 75.4 kg 76 kg    Examination:  General exam: NAD Respiratory system: CTA Cardiovascular system: S 1, S 2 RRR Gastrointestinal system: BS present, soft, nt Central nervous system: Non focal.  Extremities: Symmetric power   Data Reviewed: I have personally reviewed following labs and imaging studies  CBC: Recent Labs  Lab 07/01/22 1637 07/02/22 0033 07/02/22 0633 07/03/22 0811 07/04/22 0617  WBC 20.7* 19.4* 13.8* 12.5* 14.2*  NEUTROABS 17.9* 16.6*  --   --   --   HGB 14.4 13.0 13.8 13.5 12.9  HCT 43.0 39.1 41.9 39.6 38.6  MCV 90.5 93.3 93.9 91.5 90.4  PLT 275 259 267 305 213    Basic Metabolic Panel: Recent Labs  Lab 07/01/22 1637 07/02/22 0033 07/02/22 0865 07/03/22 0811 07/04/22  0617  NA 139 137 137 134* 137  K 3.5 3.8 3.6 3.4* 3.0*  CL 106 104 107 104 103  CO2 20* 21* 18* 17* 19*  GLUCOSE 139* 106* 94 141* 113*  BUN '18 14 12 8 8  '$ CREATININE 0.82 0.67 0.68 0.49 0.43*  CALCIUM 9.5 8.7* 8.7* 8.6* 8.5*  MG 2.1 2.0  --  1.8  --     GFR: Estimated Creatinine Clearance: 61.6 mL/min (A) (by C-G formula based on SCr of 0.43 mg/dL (L)). Liver  Function Tests: Recent Labs  Lab 07/01/22 1637 07/02/22 0033  AST 13* 12*  ALT 19 17  ALKPHOS 59 50  BILITOT 1.0 1.1  PROT 8.0 7.4  ALBUMIN 4.0 3.4*    Recent Labs  Lab 07/01/22 1637  LIPASE 22    No results for input(s): "AMMONIA" in the last 168 hours. Coagulation Profile: Recent Labs  Lab 07/02/22 0033  INR 1.1    Cardiac Enzymes: No results for input(s): "CKTOTAL", "CKMB", "CKMBINDEX", "TROPONINI" in the last 168 hours. BNP (last 3 results) No results for input(s): "PROBNP" in the last 8760 hours. HbA1C: Recent Labs    07/02/22 0633  HGBA1C 6.3*    CBG: Recent Labs  Lab 07/03/22 0725 07/03/22 1142 07/03/22 1641 07/03/22 2157 07/04/22 0744  GLUCAP 147* 136* 151* 115* 132*    Lipid Profile: No results for input(s): "CHOL", "HDL", "LDLCALC", "TRIG", "CHOLHDL", "LDLDIRECT" in the last 72 hours. Thyroid Function Tests: No results for input(s): "TSH", "T4TOTAL", "FREET4", "T3FREE", "THYROIDAB" in the last 72 hours. Anemia Panel: No results for input(s): "VITAMINB12", "FOLATE", "FERRITIN", "TIBC", "IRON", "RETICCTPCT" in the last 72 hours. Sepsis Labs: Recent Labs  Lab 07/02/22 0739  LATICACIDVEN 1.5     No results found for this or any previous visit (from the past 240 hour(s)).       Radiology Studies: No results found.      Scheduled Meds:  diclofenac Sodium  2 g Topical QID   enoxaparin (LOVENOX) injection  40 mg Subcutaneous Q24H   levothyroxine  175 mcg Oral Q0600   Continuous Infusions:  dextrose 5% lactated ringers 100 mL/hr at 07/03/22 1544   lactated ringers Stopped (07/01/22 2214)   piperacillin-tazobactam (ZOSYN)  IV 3.375 g (07/04/22 1046)     LOS: 3 days    Time spent: 35 minutes    Shundra Wirsing A Reshanda Lewey, MD [facility]   If 7PM-7AM, please contact night-coverage [email or web address]  07/04/2022, 3:39 PM

## 2022-07-05 LAB — GLUCOSE, CAPILLARY
Glucose-Capillary: 158 mg/dL — ABNORMAL HIGH (ref 70–99)
Glucose-Capillary: 146 mg/dL — ABNORMAL HIGH (ref 70–99)
Glucose-Capillary: 123 mg/dL — ABNORMAL HIGH (ref 70–99)
Glucose-Capillary: 134 mg/dL — ABNORMAL HIGH (ref 70–99)

## 2022-07-05 LAB — BASIC METABOLIC PANEL
Anion gap: 10 (ref 5–15)
BUN: 5 mg/dL — ABNORMAL LOW (ref 8–23)
CO2: 25 mmol/L (ref 22–32)
Calcium: 8.1 mg/dL — ABNORMAL LOW (ref 8.9–10.3)
Chloride: 100 mmol/L (ref 98–111)
Creatinine, Ser: 0.42 mg/dL — ABNORMAL LOW (ref 0.44–1.00)
GFR, Estimated: >60 mL/min (ref >60)
Glucose, Bld: 162 mg/dL — ABNORMAL HIGH (ref 70–99)
Potassium: 2.5 mmol/L — CL (ref 3.5–5.1)
Sodium: 135 mmol/L (ref 135–145)

## 2022-07-05 LAB — CBC
HCT: 38.8 % (ref 36.0–46.0)
Hemoglobin: 13.3 g/dL (ref 12.0–15.0)
MCH: 30.4 pg (ref 26.0–34.0)
MCHC: 34.3 g/dL (ref 30.0–36.0)
MCV: 88.6 fL (ref 80.0–100.0)
Platelets: 357 10*3/uL (ref 150–400)
RBC: 4.38 MIL/uL (ref 3.87–5.11)
RDW: 13.9 % (ref 11.5–15.5)
WBC: 15.9 10*3/uL — ABNORMAL HIGH (ref 4.0–10.5)
nRBC: 0 % (ref 0.0–0.2)

## 2022-07-05 MED ORDER — MELATONIN 3 MG PO TABS
3.0000 mg | ORAL_TABLET | Freq: Once | ORAL | Status: AC
Start: 2022-07-05 — End: 2022-07-05
  Administered 2022-07-05: 3 mg via ORAL
  Filled 2022-07-05: qty 1

## 2022-07-05 MED ORDER — POTASSIUM CHLORIDE 10 MEQ/100ML IV SOLN
10.0000 meq | INTRAVENOUS | Status: AC
  Administered 2022-07-05 – 2022-07-06 (×4): 10 meq via INTRAVENOUS
  Filled 2022-07-05: qty 100

## 2022-07-05 MED ORDER — POTASSIUM CHLORIDE CRYS ER 20 MEQ PO TBCR
40.0000 meq | EXTENDED_RELEASE_TABLET | ORAL | Status: AC
  Administered 2022-07-05: 40 meq via ORAL
  Filled 2022-07-05: qty 2

## 2022-07-05 MED ORDER — LIDOCAINE 5 % EX PTCH
1.0000 | MEDICATED_PATCH | CUTANEOUS | Status: DC
  Administered 2022-07-05 – 2022-07-13 (×5): 1 via TRANSDERMAL
  Filled 2022-07-05 (×9): qty 1

## 2022-07-05 NOTE — Progress Notes (Signed)
   Subjective/Chief Complaint: Talk with patient using translator pad.  She feels better.  Tolerating diet.   Objective: Vital signs in last 24 hours: Temp:  [98.3 F (36.8 C)-98.9 F (37.2 C)] 98.8 F (37.1 C) (07/30 0528) Pulse Rate:  [72-81] 72 (07/30 0528) Resp:  [18-20] 18 (07/30 0528) BP: (122-139)/(59-62) 122/62 (07/30 0528) SpO2:  [92 %-94 %] 92 % (07/30 0528) Weight:  [75.1 kg] 75.1 kg (07/30 0528) Last BM Date : 07/03/22  Intake/Output from previous day: 07/29 0701 - 07/30 0700 In: 480 [P.O.:480] Out: -  Intake/Output this shift: No intake/output data recorded.  GI: Mild distention.  No significant rigidity.  Mild left lower quadrant tenderness to palpation.  No diffuse peritonitis.  Lab Results:  Recent Labs    07/04/22 0617 07/05/22 0603  WBC 14.2* 15.9*  HGB 12.9 13.3  HCT 38.6 38.8  PLT 330 357   BMET Recent Labs    07/04/22 0617 07/05/22 0603  NA 137 135  K 3.0* 2.5*  CL 103 100  CO2 19* 25  GLUCOSE 113* 162*  BUN 8 5*  CREATININE 0.43* 0.42*  CALCIUM 8.5* 8.1*   PT/INR No results for input(s): "LABPROT", "INR" in the last 72 hours. ABG No results for input(s): "PHART", "HCO3" in the last 72 hours.  Invalid input(s): "PCO2", "PO2"  Studies/Results: No results found.  Anti-infectives: Anti-infectives (From admission, onward)    Start     Dose/Rate Route Frequency Ordered Stop   07/02/22 0645  piperacillin-tazobactam (ZOSYN) IVPB 3.375 g  Status:  Discontinued        3.375 g 12.5 mL/hr over 240 Minutes Intravenous Every 8 hours 07/02/22 0546 07/02/22 0550   07/02/22 0200  piperacillin-tazobactam (ZOSYN) IVPB 3.375 g        3.375 g 12.5 mL/hr over 240 Minutes Intravenous Every 8 hours 07/01/22 2115     07/01/22 2015  piperacillin-tazobactam (ZOSYN) IVPB 3.375 g        3.375 g 100 mL/hr over 30 Minutes Intravenous  Once 07/01/22 2001 07/01/22 2111       Assessment/Plan:   Sepsis ([place]) 07/02/2022   Hypertension     DM2  (diabetes mellitus, type 2) ([place])     Acquired hypothyroidism     Perforated diverticulum 07/01/2022   Diverticulitis of colon with perforation 07/01/2022   Left ankle sprain 06/03/2015      Perforated sigmoid diverticulitis with free air, no free fluid   Clinically doing well Ok advancing as tolerated Continue IV abx We will follow with you Ok for chemical dvt ppx from our perspective   White count up slightly.  Recommend CT scan Monday to follow-up for abscess.  If she is doing well her CT scan does not show an abscess, she can be discharged home Monday on Augmentin to complete the remainder of her antibiotic course which should be 10 days total.  Discussed with the help of a translator and patient voiced understanding.  Total time 25 minutes  LOS: 4 days    Marcello Moores A Shanicka Oldenkamp 07/05/2022

## 2022-07-06 ENCOUNTER — Encounter (HOSPITAL_COMMUNITY): Payer: Self-pay | Admitting: Internal Medicine

## 2022-07-06 ENCOUNTER — Inpatient Hospital Stay (HOSPITAL_COMMUNITY): Payer: Self-pay

## 2022-07-06 LAB — CBC
HCT: 35.9 % — ABNORMAL LOW (ref 36.0–46.0)
Hemoglobin: 12 g/dL (ref 12.0–15.0)
MCH: 30.6 pg (ref 26.0–34.0)
MCHC: 33.4 g/dL (ref 30.0–36.0)
MCV: 91.6 fL (ref 80.0–100.0)
Platelets: 303 10*3/uL (ref 150–400)
RBC: 3.92 MIL/uL (ref 3.87–5.11)
RDW: 14.5 % (ref 11.5–15.5)
WBC: 14.9 10*3/uL — ABNORMAL HIGH (ref 4.0–10.5)
nRBC: 0 % (ref 0.0–0.2)

## 2022-07-06 LAB — BASIC METABOLIC PANEL
Anion gap: 9 (ref 5–15)
BUN: <5 mg/dL — ABNORMAL LOW (ref 8–23)
CO2: 23 mmol/L (ref 22–32)
Calcium: 8.1 mg/dL — ABNORMAL LOW (ref 8.9–10.3)
Chloride: 105 mmol/L (ref 98–111)
Creatinine, Ser: 0.4 mg/dL — ABNORMAL LOW (ref 0.44–1.00)
GFR, Estimated: >60 mL/min (ref >60)
Glucose, Bld: 145 mg/dL — ABNORMAL HIGH (ref 70–99)
Potassium: 3.2 mmol/L — ABNORMAL LOW (ref 3.5–5.1)
Sodium: 137 mmol/L (ref 135–145)

## 2022-07-06 LAB — GLUCOSE, CAPILLARY
Glucose-Capillary: 135 mg/dL — ABNORMAL HIGH (ref 70–99)
Glucose-Capillary: 128 mg/dL — ABNORMAL HIGH (ref 70–99)
Glucose-Capillary: 139 mg/dL — ABNORMAL HIGH (ref 70–99)
Glucose-Capillary: 155 mg/dL — ABNORMAL HIGH (ref 70–99)

## 2022-07-06 MED ORDER — POTASSIUM CHLORIDE 10 MEQ/100ML IV SOLN
10.0000 meq | Freq: Once | INTRAVENOUS | Status: AC
  Administered 2022-07-06: 10 meq via INTRAVENOUS

## 2022-07-06 MED ORDER — POTASSIUM CHLORIDE CRYS ER 20 MEQ PO TBCR
40.0000 meq | EXTENDED_RELEASE_TABLET | ORAL | Status: AC
  Administered 2022-07-06: 40 meq via ORAL
  Filled 2022-07-06: qty 2

## 2022-07-06 MED ORDER — IOHEXOL 9 MG/ML PO SOLN
ORAL | Status: AC
  Administered 2022-07-06: 500 mL
  Administered 2022-07-06: 500 mL via ORAL
  Filled 2022-07-06: qty 1000

## 2022-07-06 MED ORDER — POTASSIUM CHLORIDE CRYS ER 20 MEQ PO TBCR
40.0000 meq | EXTENDED_RELEASE_TABLET | Freq: Once | ORAL | Status: AC
  Administered 2022-07-06: 40 meq via ORAL
  Filled 2022-07-06: qty 2

## 2022-07-06 MED ORDER — POTASSIUM CHLORIDE 10 MEQ/100ML IV SOLN
10.0000 meq | INTRAVENOUS | Status: AC
  Administered 2022-07-06 (×2): 10 meq via INTRAVENOUS
  Filled 2022-07-06: qty 100

## 2022-07-06 MED ORDER — IOHEXOL 9 MG/ML PO SOLN
500.0000 mL | ORAL | Status: AC
  Administered 2022-07-06: 500 mL via ORAL

## 2022-07-06 MED ORDER — ZOLPIDEM TARTRATE 5 MG PO TABS
5.0000 mg | ORAL_TABLET | Freq: Every evening | ORAL | Status: DC | PRN
  Administered 2022-07-08 – 2022-07-13 (×6): 5 mg via ORAL
  Filled 2022-07-06 (×6): qty 1

## 2022-07-06 MED ORDER — KCL-LACTATED RINGERS-D5W 20 MEQ/L IV SOLN
INTRAVENOUS | Status: DC
  Filled 2022-07-06 (×9): qty 1000

## 2022-07-06 MED ORDER — IOHEXOL 300 MG/ML  SOLN
100.0000 mL | Freq: Once | INTRAMUSCULAR | Status: AC | PRN
  Administered 2022-07-06: 100 mL via INTRAVENOUS

## 2022-07-06 NOTE — Progress Notes (Addendum)
PROGRESS NOTE    Heather Vang  WEX:[id number] DOB: 1951-08-01 DOA: 07/01/2022 PCP: Pcp, No   Brief Narrative: 71 year old with past medical history significant for diabetes type 2, hypertension, acquired hypothyroidism who presented complaining of abdominal pain she was found to have perforated diverticulum.  She was found to have leukocytosis white blood cell at 20, CT abdomen and pelvis show diverticulosis of the colon with pericolonic inflammatory changes in the mid to distal descending colon consistent with acute diverticulitis.  Diffuse free air in the abdomen likely indicate perforated diverticulitis.  CT abdomen repeated, showed worsening inflammation and new Abscess. Awaiting surgery recommendations.   Assessment & Plan:   Principal Problem:   Perforated diverticulum Active Problems:   Diverticulitis of colon with perforation   Sepsis ([place])   Hypertension   DM2 (diabetes mellitus, type 2) ([place])   Acquired hypothyroidism  1-Perforated diverticulitis: With Abdominal Abscess:  -Patient presented with abdominal pain, leukocytosis, fever. -CT abdomen and pelvis showed diverticulosis of the colon with pericolonic inflammation changes in the mid to distal descending colon consistent with acute diverticulitis, free air in the abdomen likely indicates perforated diverticulitis. -Continue with IV Zosyn. -Continue with IV fluids. -Appreciate surgery evaluation. -WBC at 15. CT abdomen pelvis: showed worsening inflammatory changes in the bowel and new abscess and concern for small bowel perforation.   -Plan for diagnostic laparoscopy tomorrow.   2-Sepsis secondary to perforated diverticulum Continue with IV fluids and IV antibiotics.  Surgery following.  3-Diabetes type 2: CBG low, discontinued sliding scale Monitor CBG.  HTN;  Hold Norvasc, due to sepsis.   Acquired hypothyroidism: Continue with Synthroid  Hypokalemia; Replete IV. Will add potassium to IV fluids.    Estimated body mass index is 31.48 kg/m as calculated from the following:   Height as of this encounter: 5' 0.24" (1.53 m).   Weight as of this encounter: 73.7 kg.   DVT prophylaxis: Hold Lovenox  Code Status: Full code Family Communication: Care discussed with patient and family at bedside.  Disposition Plan:  Status is: Inpatient Remains inpatient appropriate because: management., perforated diverticulitis.     Consultants:  Surgery   Procedures:  None  Antimicrobials:    Subjective: She denies abdominal pain.  She has not been eating. Does not have an appetite.    Objective: Vitals:   07/05/22 1225 07/05/22 2004 07/06/22 0450 07/06/22 0500  BP: (!) 120/99 124/67 117/63   Pulse: 72 74 63   Resp: 18 20 (!) 22   Temp: 98.6 F (37 C) 99.8 F (37.7 C) 98.7 F (37.1 C)   TempSrc: Oral Oral Oral   SpO2: 92% 93% 94%   Weight:    73.7 kg  Height:        Intake/Output Summary (Last 24 hours) at 07/06/2022 1407 Last data filed at 07/06/2022 9381 Gross per 24 hour  Intake 2846.55 ml  Output --  Net 2846.55 ml    Filed Weights   07/04/22 0656 07/05/22 0528 07/06/22 0500  Weight: 76 kg 75.1 kg 73.7 kg    Examination:  General exam: NAD Respiratory system:  CTA Cardiovascular system: S 1, S 2 RR Gastrointestinal system: BS present, soft , distended, no tender, no rigidity  Central nervous system: Non focal.  Extremities: Symmetric power   Data Reviewed: I have personally reviewed following labs and imaging studies  CBC: Recent Labs  Lab 07/01/22 1637 07/02/22 0033 07/02/22 0175 07/03/22 0811 07/04/22 0617 07/05/22 0603 07/06/22 0524  WBC 20.7* 19.4* 13.8* 12.5* 14.2* 15.9*  14.9*  NEUTROABS 17.9* 16.6*  --   --   --   --   --   HGB 14.4 13.0 13.8 13.5 12.9 13.3 12.0  HCT 43.0 39.1 41.9 39.6 38.6 38.8 35.9*  MCV 90.5 93.3 93.9 91.5 90.4 88.6 91.6  PLT 275 259 267 305 330 357 657    Basic Metabolic Panel: Recent Labs  Lab 07/01/22 1637  07/02/22 0033 07/02/22 8469 07/03/22 0811 07/04/22 0617 07/05/22 0603 07/06/22 0524  NA 139 137 137 134* 137 135 137  K 3.5 3.8 3.6 3.4* 3.0* 2.5* 3.2*  CL 106 104 107 104 103 100 105  CO2 20* 21* 18* 17* 19* 25 23  GLUCOSE 139* 106* 94 141* 113* 162* 145*  BUN '18 14 12 8 8 '$ 5* <5*  CREATININE 0.82 0.67 0.68 0.49 0.43* 0.42* 0.40*  CALCIUM 9.5 8.7* 8.7* 8.6* 8.5* 8.1* 8.1*  MG 2.1 2.0  --  1.8  --   --   --     GFR: Estimated Creatinine Clearance: 60.7 mL/min (A) (by C-G formula based on SCr of 0.4 mg/dL (L)). Liver Function Tests: Recent Labs  Lab 07/01/22 1637 07/02/22 0033  AST 13* 12*  ALT 19 17  ALKPHOS 59 50  BILITOT 1.0 1.1  PROT 8.0 7.4  ALBUMIN 4.0 3.4*    Recent Labs  Lab 07/01/22 1637  LIPASE 22    No results for input(s): "AMMONIA" in the last 168 hours. Coagulation Profile: Recent Labs  Lab 07/02/22 0033  INR 1.1    Cardiac Enzymes: No results for input(s): "CKTOTAL", "CKMB", "CKMBINDEX", "TROPONINI" in the last 168 hours. BNP (last 3 results) No results for input(s): "PROBNP" in the last 8760 hours. HbA1C: No results for input(s): "HGBA1C" in the last 72 hours.  CBG: Recent Labs  Lab 07/05/22 1224 07/05/22 1725 07/05/22 2118 07/06/22 0740 07/06/22 1122  GLUCAP 146* 123* 134* 135* 128*    Lipid Profile: No results for input(s): "CHOL", "HDL", "LDLCALC", "TRIG", "CHOLHDL", "LDLDIRECT" in the last 72 hours. Thyroid Function Tests: No results for input(s): "TSH", "T4TOTAL", "FREET4", "T3FREE", "THYROIDAB" in the last 72 hours. Anemia Panel: No results for input(s): "VITAMINB12", "FOLATE", "FERRITIN", "TIBC", "IRON", "RETICCTPCT" in the last 72 hours. Sepsis Labs: Recent Labs  Lab 07/02/22 0739  LATICACIDVEN 1.5     No results found for this or any previous visit (from the past 240 hour(s)).       Radiology Studies: CT ABDOMEN PELVIS W CONTRAST  Result Date: 07/06/2022 CLINICAL DATA:  Left lower quadrant abdominal pain.  Known distal descending colon and proximal sigmoid colon diverticulitis and known free air on prior CT. EXAM: CT ABDOMEN AND PELVIS WITH CONTRAST TECHNIQUE: Multidetector CT imaging of the abdomen and pelvis was performed using the standard protocol following bolus administration of intravenous contrast. RADIATION DOSE REDUCTION: This exam was performed according to the departmental dose-optimization program which includes automated exposure control, adjustment of the mA and/or kV according to patient size and/or use of iterative reconstruction technique. CONTRAST:  17m OMNIPAQUE IOHEXOL 300 MG/ML  SOLN COMPARISON:  CT abdomen and pelvis 07/01/2022 FINDINGS: Lower chest: There is curvilinear posterior right lower lobe subsegmental atelectasis versus scarring that appears similar to prior. Posterolateral left lower lobe linear opacity and heterogeneous opacities with mild consolidation is a slightly different configuration compared to recent 07/01/2022 CT otherwise similar in size and amount. This may represent atelectasis but pneumonia is difficult to completely exclude. No pleural effusion. Hepatobiliary: Smooth liver contours. No focal liver mass is  identified. Mildly decreased density within the liver suggests fatty infiltration. 1 unchanged tiny low-density lesion within the central aspect of the liver adjacent to the IVC (axial series 2, image 15), too small to further characterize. Two gallstones are seen near the gallbladder neck, measuring up to 8 mm. No intrahepatic or extrahepatic biliary ductal dilatation. Pancreas: No mass or inflammatory fat stranding. No pancreatic ductal dilatation is seen. Spleen: Normal in size without focal abnormality. Adrenals/Urinary Tract: Adrenal glands are unremarkable. The kidneys enhance uniformly and are symmetric in size without hydronephrosis. No renal stone is seen. No renal mass is seen. No focal urinary bladder wall thickening. Stomach/Bowel: There is now interval  increase in the involvement of the distal colon with wall thickening and peripheral inflammatory fat stranding. This now involves the distal transverse colon, entire descending colon and the sigmoid colon. The transverse colon through the rectum is generally decompressed which contributes to the appearance of the bowel wall thickening. There are inflammatory changes greatest within the mid to distal descending colon again suggesting diverticulitis as seen on the prior 07/01/2022 CT. The terminal ileum is unremarkable. Normal appendix (axial series 2 images 57 through 61). Delete that delete that small sliding hiatal hernia is unchanged. There is a rim enhancing air and fluid collection within the left hemiabdomen measuring up to approximately 9.6 x 5.0 x 8.6 cm (axial series 2, images 35 through 51 and coronal images 51 through 67. This air tapers towards a loop of worsened moderate wall thickening and surrounding inflammatory fat stranding within the left hemiabdomen (coronal images 48 through 55), and it is difficult to exclude perforation of the small bowel in this region versus this free air interposed around the loops of small bowel. I favor there to be a perforation of the inflamed left hemiabdomen small bowel in this region. There are dilated loops of small bowel within the proximal small bowel of the upper abdomen measuring up to 4.2 cm with internal air-fluid levels. This appears to be small obstruction related to the worsening inflammatory fat stranding of the left hemiabdomen small bowel where there is also concern for perforation. Vascular/Lymphatic: No abdominal aortic aneurysm. Moderate atherosclerotic calcifications. No mesenteric, retroperitoneal, or pelvic lymphadenopathy. Reproductive: The uterus is present.  No gross adnexal abnormality. Other: There is moderate free air which is likely slightly worsened compared to 07/01/2022 prior CT.New mild free fluid within the pelvis. Musculoskeletal: Mild  multilevel degenerative disc changes of the lower thoracic spine. There are numerous rim calcifications within the bilateral buttocks, likely injection granulomas. IMPRESSION: Compared to 07/01/2022: 1. There is worsening of the inflammatory changes related to distal colonic diverticulitis, now involving the distal transverse colon through the midsigmoid colon. Previously this was questioned as the source for free air seen on 07/01/2022 CT. It is still unclear whether there is a perforation of the distal colonic diverticulitis, however that cannot be excluded. 2. There is an abscess within the left hemiabdomen measuring up to approximately 9.6 x 5.0 x 8.6 cm (transverse by AP by craniocaudal) with internal air-fluid level which closely apposes loops of worsened inflamed left hemiabdomen proximal to mid small bowel. Findings are highly concerning for a perforation of this portion of the small bowel causing pneumoperitoneum and the adjacent abscess. 3. There is new dilatation of the small bowel upstream from the inflamed left hemiabdomen small bowel concerning for small bowel obstruction due to blockage at the highly inflamed and likely perforated left hemiabdomen small bowel. 4. New mild ascites. Critical Value/emergent results were  called by telephone at the time of interpretation on 07/06/2022 at 12:34 pm to provider DR. Shawnese Magner, who verbally acknowledged these results. Electronically Signed   By: Yvonne Kendall M.D.   On: 07/06/2022 12:36        Scheduled Meds:  diclofenac Sodium  2 g Topical QID   enoxaparin (LOVENOX) injection  40 mg Subcutaneous Q24H   levothyroxine  175 mcg Oral Q0600   lidocaine  1 patch Transdermal Q24H   potassium chloride  40 mEq Oral Q4H   Continuous Infusions:  dextrose 5% lactated ringers Stopped (07/06/22 0601)   lactated ringers Stopped (07/01/22 2214)   piperacillin-tazobactam (ZOSYN)  IV 3.375 g (07/06/22 1053)     LOS: 5 days    Time spent: 35  minutes    Jamice Carreno A Keah Lamba, MD [facility]   If 7PM-7AM, please contact night-coverage [email or web address]  07/06/2022, 2:07 PM

## 2022-07-06 NOTE — Progress Notes (Signed)
Progress Note     Subjective: Discussed CT findings with patient and family member at bedside. Patient reports pain overall is better but she has no appetite. She is having diarrhea. Denies n/v.   Objective: Vital signs in last 24 hours: Temp:  [98.7 F (37.1 C)-99.8 F (37.7 C)] 99.5 F (37.5 C) (07/31 1416) Pulse Rate:  [63-74] 74 (07/31 1416) Resp:  [16-22] 16 (07/31 1416) BP: (117-126)/(63-67) 126/63 (07/31 1416) SpO2:  [93 %-94 %] 94 % (07/31 1416) Weight:  [73.7 kg] 73.7 kg (07/31 0500) Last BM Date : 07/03/22  Intake/Output from previous day: 07/30 0701 - 07/31 0700 In: 3302.6 [P.O.:574; I.V.:2011.5; IV [id number]:782] Out: -  Intake/Output this shift: No intake/output data recorded.  PE: General: pleasant, WD, overweight female who is laying in bed in NAD Respiratory effort nonlabored Abd: soft, TTP in LLQ laterally without peritonitis, ND, +BS, no masses, hernias, or organomegaly MS: all 4 extremities are symmetrical with no cyanosis, clubbing, or edema. Psych: A&Ox3 with an appropriate affect.    Lab Results:  Recent Labs    07/05/22 0603 07/06/22 0524  WBC 15.9* 14.9*  HGB 13.3 12.0  HCT 38.8 35.9*  PLT 357 303   BMET Recent Labs    07/05/22 0603 07/06/22 0524  NA 135 137  K 2.5* 3.2*  CL 100 105  CO2 25 23  GLUCOSE 162* 145*  BUN 5* <5*  CREATININE 0.42* 0.40*  CALCIUM 8.1* 8.1*   PT/INR No results for input(s): "LABPROT", "INR" in the last 72 hours. CMP     Component Value Date/Time   NA 137 07/06/2022 0524   K 3.2 (L) 07/06/2022 0524   CL 105 07/06/2022 0524   CO2 23 07/06/2022 0524   GLUCOSE 145 (H) 07/06/2022 0524   BUN <5 (L) 07/06/2022 0524   CREATININE 0.40 (L) 07/06/2022 0524   CALCIUM 8.1 (L) 07/06/2022 0524   PROT 7.4 07/02/2022 0033   ALBUMIN 3.4 (L) 07/02/2022 0033   AST 12 (L) 07/02/2022 0033   ALT 17 07/02/2022 0033   ALKPHOS 50 07/02/2022 0033   BILITOT 1.1 07/02/2022 0033   GFRNONAA >60 07/06/2022 0524    Lipase     Component Value Date/Time   LIPASE 22 07/01/2022 1637       Studies/Results: CT ABDOMEN PELVIS W CONTRAST  Result Date: 07/06/2022 CLINICAL DATA:  Left lower quadrant abdominal pain. Known distal descending colon and proximal sigmoid colon diverticulitis and known free air on prior CT. EXAM: CT ABDOMEN AND PELVIS WITH CONTRAST TECHNIQUE: Multidetector CT imaging of the abdomen and pelvis was performed using the standard protocol following bolus administration of intravenous contrast. RADIATION DOSE REDUCTION: This exam was performed according to the departmental dose-optimization program which includes automated exposure control, adjustment of the mA and/or kV according to patient size and/or use of iterative reconstruction technique. CONTRAST:  136m OMNIPAQUE IOHEXOL 300 MG/ML  SOLN COMPARISON:  CT abdomen and pelvis 07/01/2022 FINDINGS: Lower chest: There is curvilinear posterior right lower lobe subsegmental atelectasis versus scarring that appears similar to prior. Posterolateral left lower lobe linear opacity and heterogeneous opacities with mild consolidation is a slightly different configuration compared to recent 07/01/2022 CT otherwise similar in size and amount. This may represent atelectasis but pneumonia is difficult to completely exclude. No pleural effusion. Hepatobiliary: Smooth liver contours. No focal liver mass is identified. Mildly decreased density within the liver suggests fatty infiltration. 1 unchanged tiny low-density lesion within the central aspect of the liver adjacent to the IVC (  axial series 2, image 15), too small to further characterize. Two gallstones are seen near the gallbladder neck, measuring up to 8 mm. No intrahepatic or extrahepatic biliary ductal dilatation. Pancreas: No mass or inflammatory fat stranding. No pancreatic ductal dilatation is seen. Spleen: Normal in size without focal abnormality. Adrenals/Urinary Tract: Adrenal glands are  unremarkable. The kidneys enhance uniformly and are symmetric in size without hydronephrosis. No renal stone is seen. No renal mass is seen. No focal urinary bladder wall thickening. Stomach/Bowel: There is now interval increase in the involvement of the distal colon with wall thickening and peripheral inflammatory fat stranding. This now involves the distal transverse colon, entire descending colon and the sigmoid colon. The transverse colon through the rectum is generally decompressed which contributes to the appearance of the bowel wall thickening. There are inflammatory changes greatest within the mid to distal descending colon again suggesting diverticulitis as seen on the prior 07/01/2022 CT. The terminal ileum is unremarkable. Normal appendix (axial series 2 images 57 through 61). Delete that delete that small sliding hiatal hernia is unchanged. There is a rim enhancing air and fluid collection within the left hemiabdomen measuring up to approximately 9.6 x 5.0 x 8.6 cm (axial series 2, images 35 through 51 and coronal images 51 through 67. This air tapers towards a loop of worsened moderate wall thickening and surrounding inflammatory fat stranding within the left hemiabdomen (coronal images 48 through 55), and it is difficult to exclude perforation of the small bowel in this region versus this free air interposed around the loops of small bowel. I favor there to be a perforation of the inflamed left hemiabdomen small bowel in this region. There are dilated loops of small bowel within the proximal small bowel of the upper abdomen measuring up to 4.2 cm with internal air-fluid levels. This appears to be small obstruction related to the worsening inflammatory fat stranding of the left hemiabdomen small bowel where there is also concern for perforation. Vascular/Lymphatic: No abdominal aortic aneurysm. Moderate atherosclerotic calcifications. No mesenteric, retroperitoneal, or pelvic lymphadenopathy.  Reproductive: The uterus is present.  No gross adnexal abnormality. Other: There is moderate free air which is likely slightly worsened compared to 07/01/2022 prior CT.New mild free fluid within the pelvis. Musculoskeletal: Mild multilevel degenerative disc changes of the lower thoracic spine. There are numerous rim calcifications within the bilateral buttocks, likely injection granulomas. IMPRESSION: Compared to 07/01/2022: 1. There is worsening of the inflammatory changes related to distal colonic diverticulitis, now involving the distal transverse colon through the midsigmoid colon. Previously this was questioned as the source for free air seen on 07/01/2022 CT. It is still unclear whether there is a perforation of the distal colonic diverticulitis, however that cannot be excluded. 2. There is an abscess within the left hemiabdomen measuring up to approximately 9.6 x 5.0 x 8.6 cm (transverse by AP by craniocaudal) with internal air-fluid level which closely apposes loops of worsened inflamed left hemiabdomen proximal to mid small bowel. Findings are highly concerning for a perforation of this portion of the small bowel causing pneumoperitoneum and the adjacent abscess. 3. There is new dilatation of the small bowel upstream from the inflamed left hemiabdomen small bowel concerning for small bowel obstruction due to blockage at the highly inflamed and likely perforated left hemiabdomen small bowel. 4. New mild ascites. Critical Value/emergent results were called by telephone at the time of interpretation on 07/06/2022 at 12:34 pm to provider DR. REGALADO, who verbally acknowledged these results. Electronically Signed  By: Yvonne Kendall M.D.   On: 07/06/2022 12:36    Anti-infectives: Anti-infectives (From admission, onward)    Start     Dose/Rate Route Frequency Ordered Stop   07/02/22 0645  piperacillin-tazobactam (ZOSYN) IVPB 3.375 g  Status:  Discontinued        3.375 g 12.5 mL/hr over 240 Minutes  Intravenous Every 8 hours 07/02/22 0546 07/02/22 0550   07/02/22 0200  piperacillin-tazobactam (ZOSYN) IVPB 3.375 g        3.375 g 12.5 mL/hr over 240 Minutes Intravenous Every 8 hours 07/01/22 2115     07/01/22 2015  piperacillin-tazobactam (ZOSYN) IVPB 3.375 g        3.375 g 100 mL/hr over 30 Minutes Intravenous  Once 07/01/22 2001 07/01/22 2111        Assessment/Plan Perforated diverticulitis vs small bowel perforation with left sided intra-abdominal abscess - CT 7/26: with acute diverticulitis and pneumoperitoneum thought to be secondary to perforated diverticulitis, some small bowel wall thickening in left abdomen thought to be more reactive - CT 7/31: worsening of inflammation through colon and small bowel with left hem-abdominal abscess 9.6 x 5.0 x 8.6 cm which is close to several loops of small bowel which raises concern for possible small bowel perforation, although no contrast extravasation noted - patient still having some LLQ ttp without peritonitis, not much appetite, WBC 14K - discussed with patient and family member at bedside today, will plan for diagnostic laparoscopy tomorrow AM, possible laparotomy, possible bowel resection.   FEN: CLD, IVF@ 94 cc/h; NPO after MN for OR tomorrow  VTE: SCDs, ok to have SQH or LMWH from a surgical standpoint ID: Zosyn 7/26>>  - below per TRH -  HTN T2DM hypothyroidism  LOS: 5 days   I reviewed hospitalist notes, last 24 h vitals and pain scores, last 48 h intake and output, last 24 h labs and trends, last 24 h imaging results, and discussed with radiology .     Norm Parcel, [facility] Surgery 07/06/2022, 2:24 PM Please see Amion for pager number during day hours 7:00am-4:30pm

## 2022-07-07 ENCOUNTER — Encounter (HOSPITAL_COMMUNITY)
Admission: EM | Disposition: A | Discharge status: Home / Self Care | Payer: Self-pay | Source: Home / Self Care | Attending: Family Medicine

## 2022-07-07 ENCOUNTER — Inpatient Hospital Stay (HOSPITAL_COMMUNITY): Payer: Self-pay | Admitting: Anesthesiology

## 2022-07-07 ENCOUNTER — Encounter (HOSPITAL_COMMUNITY): Payer: Self-pay | Admitting: Internal Medicine

## 2022-07-07 DIAGNOSIS — Z7984 Long term (current) use of oral hypoglycemic drugs: Secondary | ICD-10-CM

## 2022-07-07 DIAGNOSIS — E119 Type 2 diabetes mellitus without complications: Secondary | ICD-10-CM

## 2022-07-07 DIAGNOSIS — K5720 Diverticulitis of large intestine with perforation and abscess without bleeding: Secondary | ICD-10-CM

## 2022-07-07 DIAGNOSIS — I10 Essential (primary) hypertension: Secondary | ICD-10-CM

## 2022-07-07 DIAGNOSIS — E039 Hypothyroidism, unspecified: Secondary | ICD-10-CM

## 2022-07-07 HISTORY — PX: LAPAROSCOPIC SMALL BOWEL RESECTION: SHX5929

## 2022-07-07 LAB — COMPREHENSIVE METABOLIC PANEL
ALT: 25 U/L (ref 0–44)
AST: 29 U/L (ref 15–41)
Albumin: 2.3 g/dL — ABNORMAL LOW (ref 3.5–5.0)
Alkaline Phosphatase: 57 U/L (ref 38–126)
Anion gap: 9 (ref 5–15)
BUN: <5 mg/dL — ABNORMAL LOW (ref 8–23)
CO2: 23 mmol/L (ref 22–32)
Calcium: 8 mg/dL — ABNORMAL LOW (ref 8.9–10.3)
Chloride: 106 mmol/L (ref 98–111)
Creatinine, Ser: 0.4 mg/dL — ABNORMAL LOW (ref 0.44–1.00)
GFR, Estimated: >60 mL/min (ref >60)
Glucose, Bld: 133 mg/dL — ABNORMAL HIGH (ref 70–99)
Potassium: 3.9 mmol/L (ref 3.5–5.1)
Sodium: 138 mmol/L (ref 135–145)
Total Bilirubin: 0.7 mg/dL (ref 0.3–1.2)
Total Protein: 6.2 g/dL — ABNORMAL LOW (ref 6.5–8.1)

## 2022-07-07 LAB — CBC
HCT: 36.7 % (ref 36.0–46.0)
Hemoglobin: 12.4 g/dL (ref 12.0–15.0)
MCH: 30.6 pg (ref 26.0–34.0)
MCHC: 33.8 g/dL (ref 30.0–36.0)
MCV: 90.6 fL (ref 80.0–100.0)
Platelets: 396 10*3/uL (ref 150–400)
RBC: 4.05 MIL/uL (ref 3.87–5.11)
RDW: 14.4 % (ref 11.5–15.5)
WBC: 20.6 10*3/uL — ABNORMAL HIGH (ref 4.0–10.5)
nRBC: 0.2 % (ref 0.0–0.2)

## 2022-07-07 LAB — TYPE AND SCREEN
ABO/RH(D): O POS
Antibody Screen: NEGATIVE

## 2022-07-07 LAB — ABO/RH: ABO/RH(D): O POS

## 2022-07-07 LAB — GLUCOSE, CAPILLARY
Glucose-Capillary: 145 mg/dL — ABNORMAL HIGH (ref 70–99)
Glucose-Capillary: 175 mg/dL — ABNORMAL HIGH (ref 70–99)
Glucose-Capillary: 147 mg/dL — ABNORMAL HIGH (ref 70–99)

## 2022-07-07 SURGERY — EXCISION, SMALL INTESTINE, LAPAROSCOPIC
Anesthesia: General

## 2022-07-07 MED ORDER — LACTATED RINGERS IV SOLN: INTRAVENOUS | Status: DC

## 2022-07-07 MED ORDER — ALBUMIN HUMAN 5 % IV SOLN
INTRAVENOUS | Status: AC
  Filled 2022-07-07: qty 250

## 2022-07-07 MED ORDER — ONDANSETRON HCL 4 MG/2ML IJ SOLN
INTRAMUSCULAR | Status: DC | PRN
  Administered 2022-07-07: 4 mg via INTRAVENOUS

## 2022-07-07 MED ORDER — OXYCODONE HCL 5 MG PO TABS
5.0000 mg | ORAL_TABLET | ORAL | Status: DC | PRN
  Administered 2022-07-12 (×2): 5 mg via ORAL
  Filled 2022-07-07 (×2): qty 1

## 2022-07-07 MED ORDER — DEXAMETHASONE SODIUM PHOSPHATE 10 MG/ML IJ SOLN
INTRAMUSCULAR | Status: DC | PRN
  Administered 2022-07-07: 6 mg via INTRAVENOUS

## 2022-07-07 MED ORDER — LIDOCAINE HCL (PF) 2 % IJ SOLN
INTRAMUSCULAR | Status: AC
Start: 2022-07-07 — End: ?
  Filled 2022-07-07: qty 5

## 2022-07-07 MED ORDER — KETOROLAC TROMETHAMINE 30 MG/ML IJ SOLN
INTRAMUSCULAR | Status: AC
  Filled 2022-07-07: qty 1

## 2022-07-07 MED ORDER — HYDROMORPHONE HCL 1 MG/ML IJ SOLN
0.2500 mg | INTRAMUSCULAR | Status: DC | PRN
  Administered 2022-07-07 (×2): 0.5 mg via INTRAVENOUS

## 2022-07-07 MED ORDER — DEXAMETHASONE SODIUM PHOSPHATE 10 MG/ML IJ SOLN
INTRAMUSCULAR | Status: AC
  Filled 2022-07-07: qty 1

## 2022-07-07 MED ORDER — FENTANYL CITRATE (PF) 250 MCG/5ML IJ SOLN
INTRAMUSCULAR | Status: AC
  Filled 2022-07-07: qty 5

## 2022-07-07 MED ORDER — EPHEDRINE 5 MG/ML INJ
INTRAVENOUS | Status: AC
  Filled 2022-07-07: qty 5

## 2022-07-07 MED ORDER — LIDOCAINE HCL (PF) 2 % IJ SOLN
INTRAMUSCULAR | Status: AC
  Filled 2022-07-07: qty 5

## 2022-07-07 MED ORDER — MIDAZOLAM HCL 5 MG/5ML IJ SOLN
INTRAMUSCULAR | Status: DC | PRN
  Administered 2022-07-07: 2 mg via INTRAVENOUS

## 2022-07-07 MED ORDER — ROCURONIUM BROMIDE 10 MG/ML (PF) SYRINGE
PREFILLED_SYRINGE | INTRAVENOUS | Status: DC | PRN
  Administered 2022-07-07 (×2): 20 mg via INTRAVENOUS
  Administered 2022-07-07: 50 mg via INTRAVENOUS
  Administered 2022-07-07: 20 mg via INTRAVENOUS

## 2022-07-07 MED ORDER — HYDROMORPHONE HCL 1 MG/ML IJ SOLN
INTRAMUSCULAR | Status: AC
  Filled 2022-07-07: qty 1

## 2022-07-07 MED ORDER — SUCCINYLCHOLINE CHLORIDE 200 MG/10ML IV SOSY
PREFILLED_SYRINGE | INTRAVENOUS | Status: DC | PRN
  Administered 2022-07-07: 120 mg via INTRAVENOUS

## 2022-07-07 MED ORDER — MIDAZOLAM HCL 2 MG/2ML IJ SOLN
INTRAMUSCULAR | Status: AC
Start: 2022-07-07 — End: ?
  Filled 2022-07-07: qty 2

## 2022-07-07 MED ORDER — PROPOFOL 10 MG/ML IV BOLUS
INTRAVENOUS | Status: DC | PRN
  Administered 2022-07-07: 140 mg via INTRAVENOUS

## 2022-07-07 MED ORDER — KETAMINE HCL 10 MG/ML IJ SOLN
INTRAMUSCULAR | Status: DC | PRN
  Administered 2022-07-07 (×2): 20 mg via INTRAVENOUS
  Administered 2022-07-07: 10 mg via INTRAVENOUS

## 2022-07-07 MED ORDER — ONDANSETRON HCL 4 MG/2ML IJ SOLN: 4.0000 mg | Freq: Once | INTRAMUSCULAR | Status: DC | PRN

## 2022-07-07 MED ORDER — EPHEDRINE SULFATE-NACL 50-0.9 MG/10ML-% IV SOSY
PREFILLED_SYRINGE | INTRAVENOUS | Status: DC | PRN
  Administered 2022-07-07: 5 mg via INTRAVENOUS
  Administered 2022-07-07: 10 mg via INTRAVENOUS
  Administered 2022-07-07: 5 mg via INTRAVENOUS

## 2022-07-07 MED ORDER — BUPIVACAINE LIPOSOME 1.3 % IJ SUSP
INTRAMUSCULAR | Status: AC
  Filled 2022-07-07: qty 20

## 2022-07-07 MED ORDER — ROCURONIUM BROMIDE 10 MG/ML (PF) SYRINGE
PREFILLED_SYRINGE | INTRAVENOUS | Status: AC
  Filled 2022-07-07: qty 10

## 2022-07-07 MED ORDER — SODIUM CHLORIDE 0.9 % IR SOLN
Status: DC | PRN
  Administered 2022-07-07: 1000 mL
  Administered 2022-07-07: 2000 mL

## 2022-07-07 MED ORDER — ONDANSETRON HCL 4 MG/2ML IJ SOLN
INTRAMUSCULAR | Status: AC
  Filled 2022-07-07: qty 2

## 2022-07-07 MED ORDER — ALBUMIN HUMAN 5 % IV SOLN
INTRAVENOUS | Status: AC
Start: 2022-07-07 — End: ?
  Filled 2022-07-07: qty 250

## 2022-07-07 MED ORDER — FENTANYL CITRATE (PF) 100 MCG/2ML IJ SOLN
INTRAMUSCULAR | Status: AC
  Filled 2022-07-07: qty 2

## 2022-07-07 MED ORDER — ALBUMIN HUMAN 5 % IV SOLN: INTRAVENOUS | Status: DC | PRN

## 2022-07-07 MED ORDER — KETAMINE HCL 10 MG/ML IJ SOLN
INTRAMUSCULAR | Status: AC
  Filled 2022-07-07: qty 1

## 2022-07-07 MED ORDER — LIDOCAINE 2% (20 MG/ML) 5 ML SYRINGE
INTRAMUSCULAR | Status: DC | PRN
  Administered 2022-07-07: 80 mg via INTRAVENOUS

## 2022-07-07 MED ORDER — BUPIVACAINE-EPINEPHRINE (PF) 0.25% -1:200000 IJ SOLN
INTRAMUSCULAR | Status: AC
  Filled 2022-07-07: qty 30

## 2022-07-07 MED ORDER — ACETAMINOPHEN 10 MG/ML IV SOLN
INTRAVENOUS | Status: DC | PRN
  Administered 2022-07-07: 1000 mg via INTRAVENOUS

## 2022-07-07 MED ORDER — NAPHAZOLINE-GLYCERIN 0.012-0.25 % OP SOLN
1.0000 [drp] | Freq: Four times a day (QID) | OPHTHALMIC | Status: DC | PRN
  Administered 2022-07-07: 2 [drp] via OPHTHALMIC
  Filled 2022-07-07: qty 15

## 2022-07-07 MED ORDER — CHLORHEXIDINE GLUCONATE 0.12 % MT SOLN
15.0000 mL | Freq: Once | OROMUCOSAL | Status: AC
  Administered 2022-07-07: 15 mL via OROMUCOSAL

## 2022-07-07 MED ORDER — ACETAMINOPHEN 10 MG/ML IV SOLN
INTRAVENOUS | Status: AC
  Filled 2022-07-07: qty 100

## 2022-07-07 MED ORDER — CHLORHEXIDINE GLUCONATE CLOTH 2 % EX PADS
6.0000 | MEDICATED_PAD | Freq: Every day | CUTANEOUS | Status: DC
  Administered 2022-07-07 – 2022-07-14 (×7): 6 via TOPICAL

## 2022-07-07 MED ORDER — METHOCARBAMOL 500 MG PO TABS
500.0000 mg | ORAL_TABLET | Freq: Three times a day (TID) | ORAL | Status: DC
  Administered 2022-07-07 – 2022-07-08 (×3): 500 mg via ORAL
  Filled 2022-07-07 (×3): qty 1

## 2022-07-07 MED ORDER — SUGAMMADEX SODIUM 200 MG/2ML IV SOLN
INTRAVENOUS | Status: DC | PRN
  Administered 2022-07-07: 200 mg via INTRAVENOUS

## 2022-07-07 MED ORDER — ROCURONIUM BROMIDE 10 MG/ML (PF) SYRINGE
PREFILLED_SYRINGE | INTRAVENOUS | Status: AC
  Filled 2022-07-07: qty 20

## 2022-07-07 MED ORDER — BUPIVACAINE-EPINEPHRINE 0.25% -1:200000 IJ SOLN
INTRAMUSCULAR | Status: DC | PRN
  Administered 2022-07-07: 30 mL

## 2022-07-07 MED ORDER — FENTANYL CITRATE (PF) 100 MCG/2ML IJ SOLN
INTRAMUSCULAR | Status: DC | PRN
  Administered 2022-07-07 (×2): 25 ug via INTRAVENOUS
  Administered 2022-07-07 (×2): 50 ug via INTRAVENOUS
  Administered 2022-07-07: 150 ug via INTRAVENOUS
  Administered 2022-07-07 (×2): 25 ug via INTRAVENOUS

## 2022-07-07 MED ORDER — PHENYLEPHRINE 80 MCG/ML (10ML) SYRINGE FOR IV PUSH (FOR BLOOD PRESSURE SUPPORT)
PREFILLED_SYRINGE | INTRAVENOUS | Status: DC | PRN
  Administered 2022-07-07 (×3): 80 ug via INTRAVENOUS

## 2022-07-07 MED ORDER — LACTATED RINGERS IV SOLN: INTRAVENOUS | Status: DC | PRN

## 2022-07-07 MED ORDER — NAPHAZOLINE-GLYCERIN 0.012-0.2 % OP SOLN: 1.0000 [drp] | Freq: Four times a day (QID) | OPHTHALMIC | Status: DC | PRN

## 2022-07-07 MED ORDER — KETOROLAC TROMETHAMINE 30 MG/ML IJ SOLN
15.0000 mg | Freq: Once | INTRAMUSCULAR | Status: AC | PRN
  Administered 2022-07-07: 15 mg via INTRAVENOUS

## 2022-07-07 SURGICAL SUPPLY — 77 items
APPLIER CLIP 5 13 M/L LIGAMAX5 (MISCELLANEOUS)
BAG COUNTER SPONGE SURGICOUNT (BAG) ×2 IMPLANT
BLADE EXTENDED COATED 6.5IN (ELECTRODE) ×1 IMPLANT
CABLE HIGH FREQUENCY MONO STRZ (ELECTRODE) IMPLANT
CELLS DAT CNTRL 66122 CELL SVR (MISCELLANEOUS) IMPLANT
CHLORAPREP W/TINT 26 (MISCELLANEOUS) ×2 IMPLANT
CLIP APPLIE 5 13 M/L LIGAMAX5 (MISCELLANEOUS) IMPLANT
DERMABOND ADVANCED (GAUZE/BANDAGES/DRESSINGS) ×1
DERMABOND ADVANCED .7 DNX12 (GAUZE/BANDAGES/DRESSINGS) ×1 IMPLANT
DRAIN CHANNEL 19F RND (DRAIN) IMPLANT
DRAIN PENROSE 0.25X18 (DRAIN) ×1 IMPLANT
DRAPE LAPAROSCOPIC ABDOMINAL (DRAPES) ×2 IMPLANT
DRAPE SURG IRRIG POUCH 19X23 (DRAPES) ×2 IMPLANT
DRSG OPSITE POSTOP 4X10 (GAUZE/BANDAGES/DRESSINGS) ×1 IMPLANT
DRSG OPSITE POSTOP 4X6 (GAUZE/BANDAGES/DRESSINGS) IMPLANT
DRSG OPSITE POSTOP 4X8 (GAUZE/BANDAGES/DRESSINGS) IMPLANT
ELECT REM PT RETURN 15FT ADLT (MISCELLANEOUS) ×2 IMPLANT
EVACUATOR SILICONE 100CC (DRAIN) IMPLANT
GAUZE SPONGE 4X4 12PLY STRL (GAUZE/BANDAGES/DRESSINGS) ×1 IMPLANT
GLOVE BIO SURGEON STRL SZ 6.5 (GLOVE) ×4 IMPLANT
GLOVE BIOGEL PI IND STRL 7.0 (GLOVE) ×2 IMPLANT
GLOVE BIOGEL PI INDICATOR 7.0 (GLOVE) ×2
GLOVE INDICATOR 6.5 STRL GRN (GLOVE) ×2 IMPLANT
GOWN STRL REUS W/ TWL XL LVL3 (GOWN DISPOSABLE) ×6 IMPLANT
GOWN STRL REUS W/TWL XL LVL3 (GOWN DISPOSABLE) ×12
HOLDER FOLEY CATH W/STRAP (MISCELLANEOUS) ×2 IMPLANT
IRRIG SUCT STRYKERFLOW 2 WTIP (MISCELLANEOUS) ×2
IRRIGATION SUCT STRKRFLW 2 WTP (MISCELLANEOUS) ×1 IMPLANT
KIT TURNOVER KIT A (KITS) IMPLANT
LIGASURE IMPACT 36 18CM CVD LR (INSTRUMENTS) ×1 IMPLANT
NDL INSUFFLATION 14GA 120MM (NEEDLE) IMPLANT
NEEDLE INSUFFLATION 14GA 120MM (NEEDLE) ×2 IMPLANT
PACK COLON (CUSTOM PROCEDURE TRAY) ×2 IMPLANT
PAD POSITIONING PINK XL (MISCELLANEOUS) ×2 IMPLANT
PENCIL SMOKE EVACUATOR (MISCELLANEOUS) IMPLANT
PROTECTOR NERVE ULNAR (MISCELLANEOUS) ×2 IMPLANT
RETRACTOR WND ALEXIS 18 MED (MISCELLANEOUS) IMPLANT
RTRCTR WOUND ALEXIS 18CM MED (MISCELLANEOUS)
SCISSORS LAP 5X35 DISP (ENDOMECHANICALS) ×2 IMPLANT
SEALER TISSUE G2 STRG ARTC 35C (ENDOMECHANICALS) ×2 IMPLANT
SET TUBE SMOKE EVAC HIGH FLOW (TUBING) ×2 IMPLANT
SLEEVE Z-THREAD 5X100MM (TROCAR) ×3 IMPLANT
SPIKE FLUID TRANSFER (MISCELLANEOUS) ×2 IMPLANT
SPONGE DRAIN TRACH 4X4 STRL 2S (GAUZE/BANDAGES/DRESSINGS) IMPLANT
SPONGE T-LAP 18X18 ~~LOC~~+RFID (SPONGE) ×1 IMPLANT
STAPLER CVD CUT GN 40 RELOAD (ENDOMECHANICALS) ×2 IMPLANT
STAPLER CVD CUT GRN 40 RELOAD (ENDOMECHANICALS) IMPLANT
STAPLER PROXIMATE 75MM BLUE (STAPLE) ×1 IMPLANT
SURGILUBE 2OZ TUBE FLIPTOP (MISCELLANEOUS) ×2 IMPLANT
SUT ETHILON 2 0 PS N (SUTURE) IMPLANT
SUT ETHILON 3 0 FSL (SUTURE) ×1 IMPLANT
SUT MNCRL AB 4-0 PS2 18 (SUTURE) ×2 IMPLANT
SUT NOVA NAB DX-16 0-1 5-0 T12 (SUTURE) ×4 IMPLANT
SUT PROLENE 0 SH 30 (SUTURE) ×1 IMPLANT
SUT PROLENE 2 0 KS (SUTURE) IMPLANT
SUT SILK 2 0 (SUTURE) ×2
SUT SILK 2 0 SH CR/8 (SUTURE) ×1 IMPLANT
SUT SILK 2-0 18XBRD TIE 12 (SUTURE) ×1 IMPLANT
SUT SILK 3 0 (SUTURE) ×2
SUT SILK 3 0 SH CR/8 (SUTURE) ×3 IMPLANT
SUT SILK 3-0 18XBRD TIE 12 (SUTURE) IMPLANT
SUT STRAFIX SYMMETRIC 1-0 24 (SUTURE) ×4
SUT VIC AB 2-0 SH 18 (SUTURE) ×3 IMPLANT
SUT VIC AB 3-0 SH 18 (SUTURE) ×2 IMPLANT
SUT VIC AB 4-0 PS2 27 (SUTURE) ×2 IMPLANT
SUT VICRYL 0 UR6 27IN ABS (SUTURE) ×2 IMPLANT
SUTURE STRAFIX SYMMETRC 1-0 24 (SUTURE) IMPLANT
SYS LAPSCP GELPORT 120MM (MISCELLANEOUS)
SYS WOUND ALEXIS 18CM MED (MISCELLANEOUS) ×2
SYSTEM LAPSCP GELPORT 120MM (MISCELLANEOUS) IMPLANT
SYSTEM WOUND ALEXIS 18CM MED (MISCELLANEOUS) ×1 IMPLANT
TOWEL OR NON WOVEN STRL DISP B (DISPOSABLE) ×2 IMPLANT
TRAY FOLEY MTR SLVR 16FR STAT (SET/KITS/TRAYS/PACK) IMPLANT
TRAY FOLEY SLVR 14FR TEMP STAT (SET/KITS/TRAYS/PACK) ×1 IMPLANT
TROCAR BALLN 12MMX100 BLUNT (TROCAR) IMPLANT
TROCAR Z-THREAD OPTICAL 5X100M (TROCAR) ×2 IMPLANT
TUBING CONNECTING 10 (TUBING) ×2 IMPLANT

## 2022-07-07 NOTE — Op Note (Signed)
Patient: Heather Vang (Apr 21, 1951, [id number])  Date of Surgery: 07/07/22  Preoperative Diagnosis: BOWEL PERFORATION WITH ABSCESS   Postoperative Diagnosis: DESCENDING COLON PERFORATION WITH ABSCESS   Surgical Procedure: DIAGNOSTIC LAPAROSCOPY CONVERTED TO OPEN HARTMANN'S PROCEDURE:   Mobilization of the splenic flexure  Operative Team Members:  Surgeon(s) and Role:    * Jyaire Koudelka, Nickola Major, MD - Primary   Anesthesiologist: Annye Asa, MD; Freddrick March, MD CRNA: Milford Cage, CRNA; Montel Clock, CRNA; Lavina Hamman, CRNA   Anesthesia: General   Fluids:  Total I/O In: 3500 [I.V.:3000; IV Piggyback:500] Out: 1500 [Urine:600; YHCWC:376]  Complications: None  Drains:  Penrose drain in the subcutaneous midline wound    Specimen:  ID Type Source Tests Collected by Time Destination  1 : Left colon stitch marks distal margin Tissue PATH GI Other SURGICAL PATHOLOGY Anah Billard, Nickola Major, MD 07/07/2022 1309      Disposition:  PACU - hemodynamically stable.  Plan of Care:  Continue inpatient care    Indications for Procedure: [name] is a 71 y.o. female who presented with abdominal pain.  Initial work-up was concerning for perforated diverticulitis with abscess.  The abscess was not drainable due to its location.  Repeat scan demonstrated worsening of her diverticulitis and inflammation around the small intestine concerning for possible small bowel perforation or other etiology of her pain.  I recommended a diagnostic laparoscopy, possible exploratory laparotomy, with possible bowel resection, possible ostomy.    The procedure itself as well as its risks, benefits and alternatives were discussed with the aid of the video interpreter system.  The risks discussed included but were not limited to the risk of infection, bleeding, damage to nearby structures, and need for additional surgeries or procedures postoperatively.  After a full  discussion and all questions answered the patient granted consent to proceed.  Findings: Severe inflammation surrounding the descending colon with a perforation controlled by the small bowel and omentum with severe secondary inflammation on the small intestine without clear small intestinal pathology.   Description of Procedure:   On the date stated above the patient taken the operating room suite placed in supine position.  General endotracheal anesthesia was induced.  A timeout was completed verifying the correct patient, procedure, position, and equipment needed for the case.  The patient's abdomen was prepped and draped in usual sterile fashion.  Antibiotics were given prior to the case start.  A 5 mm trocar was inserted through the umbilicus after Veress insufflation and the abdomen was inspected.  There is no trauma the underlying viscera with initial trocar placement.  There is dense inflammation in the left abdomen.  2 additional trocars were placed in the right abdomen.  Glassman instruments were used to run the small bowel from the ligament of Treitz proximally.  We encountered an area of dense adhesions between the small bowel and the descending colon.  There was a large abscess cavity that was entered in this area.  The left abdomen had feculent peritonitis.  We are unable to fully mobilize the small bowel safely laparoscopically so we did decide to convert to an open procedure.  A midline laparotomy incision was created and the abdomen was entered without any trauma to the underlying viscera.  The Bookwalter retractor was positioned using 2 post to hold it to the bed.  Retractors were placed to expose the left abdomen.  The small bowel was mobilized and I was able to finally run the small bowel from the  ligament of Treitz to the terminal ileum.  There was significant secondary inflammation of the small bowel in the area of the abscess cavity, however there were no defects in the small bowel  or clear pathology in the small bowel.  It appeared this was secondary inflammation from a colonic source.  I then worked to mobilize the left colon.  The omentum was lifted off of the distal transverse colon and the splenocolic ligaments were divided.  I worked along the [employer] to mobilize the descending colon out of the retroperitoneum.  This dissection was carried proximally in the transverse colon dissection carried distally to carefully divide all attachments and fully mobilized the splenic flexure of the colon out of the left upper quadrant.  This was difficult as this was close to the area of the abscess cavity inflammation.  I then worked to mobilize the distal sigmoid colon out of the pelvis.  Care was taken during this part of the dissection to identify and protect the left ureter.  The sigmoid colon was fully mobilized.    With the left colon fully mobilized out of the retroperitoneum, it appeared the perforation was located in the mid descending colon.  I am not sure if this was due to a malignancy or diverticulitis.  I decided to proceed with a Hartman's procedure and creation of an ostomy as I was concerned for healing issues of any type of anastomosis.  The distal transverse colon was divided just proximal to the area of unhealthy colon.  It was divided with a 75 mm blue load of the GIA linear stapler.  The mesocolon was divided with the LigaSure.  The rectum was divided just below the rectosigmoid junction identified by the sacral promontory and the splaying of the tinea.  He was divided with a green load of the contour stapler.  The specimen was passed off the field and marked with a suture to mark the distal margin.  Both edges of the rectal staple line were marked with prolene sutures.  The transverse colon appeared to reach the abdominal wall without issue after splenic flexure mobilization.  The abdomen was irrigated with multiple liters of warm saline.   A defect was created in  the abdominal wall just above and to the left of the umbilicus.  A circular incision was made in the skin and subcutaneous tissues, a cruciate incision was made in the anterior rectus fascia, the rectus muscle fibers were split along their length, the posterior sheath was divided, the defect was dilated to two fingers size.  The distal transverse colon staple line was delivered through this defect for maturation of the ostomy.  The midline fascia was closed using running #1 stratafix suture.  A 1/4 inch penrose drain was placed in the subcutaneous space of the wound and sutured in place inferiorly using nylon suture.  The skin was closed using 3-0 vicryl deep dermal suture and 4-0 subcuticular Monocryl suture.  Dermabond was applied.  The ostomy was matured using 4 brooks sutures in each quadrant and multiple sutures to reapproximate the mucocutaneous junction.  These were 3-0 vicryl.  All sponge and needle counts were correct.  Sterile dressing and ostomy appliance were applied.  The patient was transferred to PACU in stable condition.    At the end of the case we reviewed the infection status of the case. Patient: [facility] Patient Case: Urgent Infection Present At Time Of Surgery (PATOS): Feculent Peritonitis  Eddie Dibbles  Lakeyshia Tuckerman, MD General, Bariatric, & Minimally Invasive Surgery [facility], [place]

## 2022-07-07 NOTE — Anesthesia Preprocedure Evaluation (Addendum)
Anesthesia Evaluation  Patient identified by MRN, date of birth, ID band Patient awake    Reviewed: Allergy & Precautions, NPO status , Patient's Chart, lab work & pertinent test results  Airway Mallampati: II  TM Distance: >3 FB Neck ROM: Full    Dental no notable dental hx.    Pulmonary neg pulmonary ROS,    Pulmonary exam normal breath sounds clear to auscultation       Cardiovascular hypertension, Normal cardiovascular exam Rhythm:Regular Rate:Normal     Neuro/Psych negative neurological ROS  negative psych ROS   GI/Hepatic Neg liver ROS, Diverticulitis with perforation and abcess   Endo/Other  diabetesHypothyroidism   Renal/GU negative Renal ROS  negative genitourinary   Musculoskeletal negative musculoskeletal ROS (+)   Abdominal   Peds negative pediatric ROS (+)  Hematology negative hematology ROS (+)   Anesthesia Other Findings   Reproductive/Obstetrics negative OB ROS                             Anesthesia Physical Anesthesia Plan  ASA: 3  Anesthesia Plan: General   Post-op Pain Management: Ofirmev IV (intra-op)*   Induction: Intravenous and Rapid sequence  PONV Risk Score and Plan: 3 and Ondansetron, Dexamethasone and Treatment may vary due to age or medical condition  Airway Management Planned: Oral ETT  Additional Equipment:   Intra-op Plan:   Post-operative Plan: Extubation in OR  Informed Consent: I have reviewed the patients History and Physical, chart, labs and discussed the procedure including the risks, benefits and alternatives for the proposed anesthesia with the patient or authorized representative who has indicated his/her understanding and acceptance.     Dental advisory given  Plan Discussed with: CRNA and Surgeon  Anesthesia Plan Comments:        Anesthesia Quick Evaluation

## 2022-07-07 NOTE — Progress Notes (Signed)
PROGRESS NOTE    Heather Vang  WNU:[id number] DOB: 09-Aug-1951 DOA: 07/01/2022 PCP: Pcp, No   Brief Narrative: 71 year old with past medical history significant for diabetes type 2, hypertension, acquired hypothyroidism who presented complaining of abdominal pain she was found to have perforated diverticulum.  She was found to have leukocytosis white blood cell at 20, CT abdomen and pelvis show diverticulosis of the colon with pericolonic inflammatory changes in the mid to distal descending colon consistent with acute diverticulitis.  Diffuse free air in the abdomen likely indicate perforated diverticulitis.  CT abdomen repeated, showed worsening inflammation and new Abscess. Patient underwent Diagnostic laparoscopy Converted to open Hatmann's Procedure.   Assessment & Plan:   Principal Problem:   Perforated diverticulum Active Problems:   Diverticulitis of colon with perforation   Sepsis ([place])   Hypertension   DM2 (diabetes mellitus, type 2) ([place])   Acquired hypothyroidism  1-Descending Colon perforation with Abscess Perforated diverticulitis: With Abdominal Abscess:  -Patient presented with abdominal pain, leukocytosis, fever. -CT abdomen and pelvis showed diverticulosis of the colon with pericolonic inflammation changes in the mid to distal descending colon consistent with acute diverticulitis, free air in the abdomen likely indicates perforated diverticulitis. -Continue with IV Zosyn. -Continue with IV fluids. -Appreciate surgery evaluation. -WBC at 15. CT abdomen pelvis: showed worsening inflammatory changes in the bowel and new abscess and concern for small bowel perforation.   -Underwent  diagnostic laparoscopy converted to open Hartmann's Procedure on 8/01. Follow surgery recommendations.   2-Sepsis secondary to perforated diverticulum Continue with IV fluids and IV antibiotics.  Surgery following.  3-Diabetes type 2: CBG low, discontinued sliding  scale Monitor CBG.  HTN;  Holding Norvasc, due to sepsis.   Acquired hypothyroidism: Continue with Synthroid  Hypokalemia; Replaced. Added  potassium to IV fluids.   Estimated body mass index is 32.03 kg/m as calculated from the following:   Height as of this encounter: 5' (1.524 m).   Weight as of this encounter: 74.4 kg.   DVT prophylaxis: Hold Lovenox  Code Status: Full code Family Communication: Care discussed with patient and family at bedside.  Disposition Plan:  Status is: Inpatient Remains inpatient appropriate because: management., perforated diverticulitis.     Consultants:  Surgery   Procedures:  None  Antimicrobials:    Subjective: She came from Surgery, denies abdominal pain. We talk about new ostomy.  She report eye irritations.   Objective: Vitals:   07/06/22 1416 07/06/22 2203 07/07/22 0556 07/07/22 0913  BP: 126/63 120/66 125/71 134/67  Pulse: 74 76 73 77  Resp: '16 17 17 18  '$ Temp: 99.5 F (37.5 C) 99.4 F (37.4 C) 99.6 F (37.6 C) 99 F (37.2 C)  TempSrc: Oral Oral Oral Oral  SpO2: 94% 94% 95% 96%  Weight:   74.4 kg 74.4 kg  Height:    5' (1.524 m)    Intake/Output Summary (Last 24 hours) at 07/07/2022 1419 Last data filed at 07/07/2022 1413 Gross per 24 hour  Intake 3500 ml  Output 1500 ml  Net 2000 ml    Filed Weights   07/06/22 0500 07/07/22 0556 07/07/22 0913  Weight: 73.7 kg 74.4 kg 74.4 kg    Examination:  General exam: NAD Respiratory system:  CTA Cardiovascular system: S 1, S 2 RRR Gastrointestinal system: BS decreased, ostomy in place with bag, small amount of blood, midline incision cover.  Central nervous system: Non focal.  Extremities: Symmetric power.    Data Reviewed: I have personally reviewed following labs and  imaging studies  CBC: Recent Labs  Lab 07/01/22 1637 07/02/22 0033 07/02/22 5621 07/03/22 3086 07/04/22 0617 07/05/22 0603 07/06/22 0524 07/07/22 0453  WBC 20.7* 19.4*   < > 12.5* 14.2*  15.9* 14.9* 20.6*  NEUTROABS 17.9* 16.6*  --   --   --   --   --   --   HGB 14.4 13.0   < > 13.5 12.9 13.3 12.0 12.4  HCT 43.0 39.1   < > 39.6 38.6 38.8 35.9* 36.7  MCV 90.5 93.3   < > 91.5 90.4 88.6 91.6 90.6  PLT 275 259   < > 305 330 357 303 396   < > = values in this interval not displayed.    Basic Metabolic Panel: Recent Labs  Lab 07/01/22 1637 07/02/22 0033 07/02/22 5784 07/03/22 6962 07/04/22 0617 07/05/22 0603 07/06/22 0524 07/07/22 0453  NA 139 137   < > 134* 137 135 137 138  K 3.5 3.8   < > 3.4* 3.0* 2.5* 3.2* 3.9  CL 106 104   < > 104 103 100 105 106  CO2 20* 21*   < > 17* 19* '25 23 23  '$ GLUCOSE 139* 106*   < > 141* 113* 162* 145* 133*  BUN 18 14   < > 8 8 5* <5* <5*  CREATININE 0.82 0.67   < > 0.49 0.43* 0.42* 0.40* 0.40*  CALCIUM 9.5 8.7*   < > 8.6* 8.5* 8.1* 8.1* 8.0*  MG 2.1 2.0  --  1.8  --   --   --   --    < > = values in this interval not displayed.    GFR: Estimated Creatinine Clearance: 60.7 mL/min (A) (by C-G formula based on SCr of 0.4 mg/dL (L)). Liver Function Tests: Recent Labs  Lab 07/01/22 1637 07/02/22 0033 07/07/22 0453  AST 13* 12* 29  ALT '19 17 25  '$ ALKPHOS 59 50 57  BILITOT 1.0 1.1 0.7  PROT 8.0 7.4 6.2*  ALBUMIN 4.0 3.4* 2.3*    Recent Labs  Lab 07/01/22 1637  LIPASE 22    No results for input(s): "AMMONIA" in the last 168 hours. Coagulation Profile: Recent Labs  Lab 07/02/22 0033  INR 1.1    Cardiac Enzymes: No results for input(s): "CKTOTAL", "CKMB", "CKMBINDEX", "TROPONINI" in the last 168 hours. BNP (last 3 results) No results for input(s): "PROBNP" in the last 8760 hours. HbA1C: No results for input(s): "HGBA1C" in the last 72 hours.  CBG: Recent Labs  Lab 07/06/22 0740 07/06/22 1122 07/06/22 1639 07/06/22 2205 07/07/22 0746  GLUCAP 135* 128* 139* 155* 145*    Lipid Profile: No results for input(s): "CHOL", "HDL", "LDLCALC", "TRIG", "CHOLHDL", "LDLDIRECT" in the last 72 hours. Thyroid Function  Tests: No results for input(s): "TSH", "T4TOTAL", "FREET4", "T3FREE", "THYROIDAB" in the last 72 hours. Anemia Panel: No results for input(s): "VITAMINB12", "FOLATE", "FERRITIN", "TIBC", "IRON", "RETICCTPCT" in the last 72 hours. Sepsis Labs: Recent Labs  Lab 07/02/22 0739  LATICACIDVEN 1.5     No results found for this or any previous visit (from the past 240 hour(s)).       Radiology Studies: CT ABDOMEN PELVIS W CONTRAST  Result Date: 07/06/2022 CLINICAL DATA:  Left lower quadrant abdominal pain. Known distal descending colon and proximal sigmoid colon diverticulitis and known free air on prior CT. EXAM: CT ABDOMEN AND PELVIS WITH CONTRAST TECHNIQUE: Multidetector CT imaging of the abdomen and pelvis was performed using the standard protocol following bolus administration of  intravenous contrast. RADIATION DOSE REDUCTION: This exam was performed according to the departmental dose-optimization program which includes automated exposure control, adjustment of the mA and/or kV according to patient size and/or use of iterative reconstruction technique. CONTRAST:  166m OMNIPAQUE IOHEXOL 300 MG/ML  SOLN COMPARISON:  CT abdomen and pelvis 07/01/2022 FINDINGS: Lower chest: There is curvilinear posterior right lower lobe subsegmental atelectasis versus scarring that appears similar to prior. Posterolateral left lower lobe linear opacity and heterogeneous opacities with mild consolidation is a slightly different configuration compared to recent 07/01/2022 CT otherwise similar in size and amount. This may represent atelectasis but pneumonia is difficult to completely exclude. No pleural effusion. Hepatobiliary: Smooth liver contours. No focal liver mass is identified. Mildly decreased density within the liver suggests fatty infiltration. 1 unchanged tiny low-density lesion within the central aspect of the liver adjacent to the IVC (axial series 2, image 15), too small to further characterize. Two  gallstones are seen near the gallbladder neck, measuring up to 8 mm. No intrahepatic or extrahepatic biliary ductal dilatation. Pancreas: No mass or inflammatory fat stranding. No pancreatic ductal dilatation is seen. Spleen: Normal in size without focal abnormality. Adrenals/Urinary Tract: Adrenal glands are unremarkable. The kidneys enhance uniformly and are symmetric in size without hydronephrosis. No renal stone is seen. No renal mass is seen. No focal urinary bladder wall thickening. Stomach/Bowel: There is now interval increase in the involvement of the distal colon with wall thickening and peripheral inflammatory fat stranding. This now involves the distal transverse colon, entire descending colon and the sigmoid colon. The transverse colon through the rectum is generally decompressed which contributes to the appearance of the bowel wall thickening. There are inflammatory changes greatest within the mid to distal descending colon again suggesting diverticulitis as seen on the prior 07/01/2022 CT. The terminal ileum is unremarkable. Normal appendix (axial series 2 images 57 through 61). Delete that delete that small sliding hiatal hernia is unchanged. There is a rim enhancing air and fluid collection within the left hemiabdomen measuring up to approximately 9.6 x 5.0 x 8.6 cm (axial series 2, images 35 through 51 and coronal images 51 through 67. This air tapers towards a loop of worsened moderate wall thickening and surrounding inflammatory fat stranding within the left hemiabdomen (coronal images 48 through 55), and it is difficult to exclude perforation of the small bowel in this region versus this free air interposed around the loops of small bowel. I favor there to be a perforation of the inflamed left hemiabdomen small bowel in this region. There are dilated loops of small bowel within the proximal small bowel of the upper abdomen measuring up to 4.2 cm with internal air-fluid levels. This appears to be  small obstruction related to the worsening inflammatory fat stranding of the left hemiabdomen small bowel where there is also concern for perforation. Vascular/Lymphatic: No abdominal aortic aneurysm. Moderate atherosclerotic calcifications. No mesenteric, retroperitoneal, or pelvic lymphadenopathy. Reproductive: The uterus is present.  No gross adnexal abnormality. Other: There is moderate free air which is likely slightly worsened compared to 07/01/2022 prior CT.New mild free fluid within the pelvis. Musculoskeletal: Mild multilevel degenerative disc changes of the lower thoracic spine. There are numerous rim calcifications within the bilateral buttocks, likely injection granulomas. IMPRESSION: Compared to 07/01/2022: 1. There is worsening of the inflammatory changes related to distal colonic diverticulitis, now involving the distal transverse colon through the midsigmoid colon. Previously this was questioned as the source for free air seen on 07/01/2022 CT. It is still  unclear whether there is a perforation of the distal colonic diverticulitis, however that cannot be excluded. 2. There is an abscess within the left hemiabdomen measuring up to approximately 9.6 x 5.0 x 8.6 cm (transverse by AP by craniocaudal) with internal air-fluid level which closely apposes loops of worsened inflamed left hemiabdomen proximal to mid small bowel. Findings are highly concerning for a perforation of this portion of the small bowel causing pneumoperitoneum and the adjacent abscess. 3. There is new dilatation of the small bowel upstream from the inflamed left hemiabdomen small bowel concerning for small bowel obstruction due to blockage at the highly inflamed and likely perforated left hemiabdomen small bowel. 4. New mild ascites. Critical Value/emergent results were called by telephone at the time of interpretation on 07/06/2022 at 12:34 pm to provider DR. Ebbie Sorenson, who verbally acknowledged these results. Electronically Signed    By: Yvonne Kendall M.D.   On: 07/06/2022 12:36        Scheduled Meds:  [MAR Hold] diclofenac Sodium  2 g Topical QID   [MAR Hold] levothyroxine  175 mcg Oral Q0600   [MAR Hold] lidocaine  1 patch Transdermal Q24H   Continuous Infusions:  dextrose 5% lactated ringers with KCl 20 mEq/L Stopped (07/07/22 0917)   lactated ringers 50 mL/hr at 07/07/22 1043   [MAR Hold] piperacillin-tazobactam (ZOSYN)  IV 3.375 g (07/07/22 0457)     LOS: 6 days    Time spent: 35 minutes    Janille Draughon A Korey Prashad, MD [facility]   If 7PM-7AM, please contact night-coverage [email or web address]  07/07/2022, 2:19 PM

## 2022-07-07 NOTE — Anesthesia Procedure Notes (Signed)
Procedure Name: Intubation Date/Time: 07/07/2022 11:08 AM  Performed by: Lavina Hamman, CRNAPre-anesthesia Checklist: Patient identified, Emergency Drugs available, Suction available, Patient being monitored and Timeout performed Patient Re-evaluated:Patient Re-evaluated prior to induction Oxygen Delivery Method: Circle system utilized Preoxygenation: Pre-oxygenation with 100% oxygen Induction Type: IV induction, Rapid sequence and Cricoid Pressure applied Laryngoscope Size: Mac and 3 Grade View: Grade I Tube type: Oral Tube size: 7.0 mm Number of attempts: 1 Airway Equipment and Method: Stylet Placement Confirmation: ETT inserted through vocal cords under direct vision, positive ETCO2, CO2 detector and breath sounds checked- equal and bilateral Secured at: 20 cm Tube secured with: Tape Dental Injury: Teeth and Oropharynx as per pre-operative assessment  Comments: ATOI

## 2022-07-07 NOTE — Progress Notes (Addendum)
Progress Note  Day of Surgery  Subjective: Pain not bad but no appetite.  Objective: Vital signs in last 24 hours: Temp:  [99.4 F (37.4 C)-99.6 F (37.6 C)] 99.6 F (37.6 C) (08/01 0556) Pulse Rate:  [73-76] 73 (08/01 0556) Resp:  [16-17] 17 (08/01 0556) BP: (120-126)/(63-71) 125/71 (08/01 0556) SpO2:  [94 %-95 %] 95 % (08/01 0556) Weight:  [74.4 kg] 74.4 kg (08/01 0556) Last BM Date : 07/03/22  Intake/Output from previous day: 07/31 0701 - 08/01 0700 In: 240 [P.O.:240] Out: -  Intake/Output this shift: No intake/output data recorded.  PE: General: pleasant, WD, overweight female who is laying in bed in NAD Respiratory effort nonlabored Abd: soft, TTP in LLQ laterally without peritonitis, ND,  MS: all 4 extremities are symmetrical with no cyanosis, clubbing, or edema. Psych: A&Ox3 with an appropriate affect.    Lab Results:  Recent Labs    07/06/22 0524 07/07/22 0453  WBC 14.9* 20.6*  HGB 12.0 12.4  HCT 35.9* 36.7  PLT 303 396    BMET Recent Labs    07/06/22 0524 07/07/22 0453  NA 137 138  K 3.2* 3.9  CL 105 106  CO2 23 23  GLUCOSE 145* 133*  BUN <5* <5*  CREATININE 0.40* 0.40*  CALCIUM 8.1* 8.0*    PT/INR No results for input(s): "LABPROT", "INR" in the last 72 hours. CMP     Component Value Date/Time   NA 138 07/07/2022 0453   K 3.9 07/07/2022 0453   CL 106 07/07/2022 0453   CO2 23 07/07/2022 0453   GLUCOSE 133 (H) 07/07/2022 0453   BUN <5 (L) 07/07/2022 0453   CREATININE 0.40 (L) 07/07/2022 0453   CALCIUM 8.0 (L) 07/07/2022 0453   PROT 6.2 (L) 07/07/2022 0453   ALBUMIN 2.3 (L) 07/07/2022 0453   AST 29 07/07/2022 0453   ALT 25 07/07/2022 0453   ALKPHOS 57 07/07/2022 0453   BILITOT 0.7 07/07/2022 0453   GFRNONAA >60 07/07/2022 0453   Lipase     Component Value Date/Time   LIPASE 22 07/01/2022 1637       Studies/Results: CT ABDOMEN PELVIS W CONTRAST  Result Date: 07/06/2022 CLINICAL DATA:  Left lower quadrant abdominal  pain. Known distal descending colon and proximal sigmoid colon diverticulitis and known free air on prior CT. EXAM: CT ABDOMEN AND PELVIS WITH CONTRAST TECHNIQUE: Multidetector CT imaging of the abdomen and pelvis was performed using the standard protocol following bolus administration of intravenous contrast. RADIATION DOSE REDUCTION: This exam was performed according to the departmental dose-optimization program which includes automated exposure control, adjustment of the mA and/or kV according to patient size and/or use of iterative reconstruction technique. CONTRAST:  112m OMNIPAQUE IOHEXOL 300 MG/ML  SOLN COMPARISON:  CT abdomen and pelvis 07/01/2022 FINDINGS: Lower chest: There is curvilinear posterior right lower lobe subsegmental atelectasis versus scarring that appears similar to prior. Posterolateral left lower lobe linear opacity and heterogeneous opacities with mild consolidation is a slightly different configuration compared to recent 07/01/2022 CT otherwise similar in size and amount. This may represent atelectasis but pneumonia is difficult to completely exclude. No pleural effusion. Hepatobiliary: Smooth liver contours. No focal liver mass is identified. Mildly decreased density within the liver suggests fatty infiltration. 1 unchanged tiny low-density lesion within the central aspect of the liver adjacent to the IVC (axial series 2, image 15), too small to further characterize. Two gallstones are seen near the gallbladder neck, measuring up to 8 mm. No intrahepatic or extrahepatic biliary  ductal dilatation. Pancreas: No mass or inflammatory fat stranding. No pancreatic ductal dilatation is seen. Spleen: Normal in size without focal abnormality. Adrenals/Urinary Tract: Adrenal glands are unremarkable. The kidneys enhance uniformly and are symmetric in size without hydronephrosis. No renal stone is seen. No renal mass is seen. No focal urinary bladder wall thickening. Stomach/Bowel: There is now  interval increase in the involvement of the distal colon with wall thickening and peripheral inflammatory fat stranding. This now involves the distal transverse colon, entire descending colon and the sigmoid colon. The transverse colon through the rectum is generally decompressed which contributes to the appearance of the bowel wall thickening. There are inflammatory changes greatest within the mid to distal descending colon again suggesting diverticulitis as seen on the prior 07/01/2022 CT. The terminal ileum is unremarkable. Normal appendix (axial series 2 images 57 through 61). Delete that delete that small sliding hiatal hernia is unchanged. There is a rim enhancing air and fluid collection within the left hemiabdomen measuring up to approximately 9.6 x 5.0 x 8.6 cm (axial series 2, images 35 through 51 and coronal images 51 through 67. This air tapers towards a loop of worsened moderate wall thickening and surrounding inflammatory fat stranding within the left hemiabdomen (coronal images 48 through 55), and it is difficult to exclude perforation of the small bowel in this region versus this free air interposed around the loops of small bowel. I favor there to be a perforation of the inflamed left hemiabdomen small bowel in this region. There are dilated loops of small bowel within the proximal small bowel of the upper abdomen measuring up to 4.2 cm with internal air-fluid levels. This appears to be small obstruction related to the worsening inflammatory fat stranding of the left hemiabdomen small bowel where there is also concern for perforation. Vascular/Lymphatic: No abdominal aortic aneurysm. Moderate atherosclerotic calcifications. No mesenteric, retroperitoneal, or pelvic lymphadenopathy. Reproductive: The uterus is present.  No gross adnexal abnormality. Other: There is moderate free air which is likely slightly worsened compared to 07/01/2022 prior CT.New mild free fluid within the pelvis.  Musculoskeletal: Mild multilevel degenerative disc changes of the lower thoracic spine. There are numerous rim calcifications within the bilateral buttocks, likely injection granulomas. IMPRESSION: Compared to 07/01/2022: 1. There is worsening of the inflammatory changes related to distal colonic diverticulitis, now involving the distal transverse colon through the midsigmoid colon. Previously this was questioned as the source for free air seen on 07/01/2022 CT. It is still unclear whether there is a perforation of the distal colonic diverticulitis, however that cannot be excluded. 2. There is an abscess within the left hemiabdomen measuring up to approximately 9.6 x 5.0 x 8.6 cm (transverse by AP by craniocaudal) with internal air-fluid level which closely apposes loops of worsened inflamed left hemiabdomen proximal to mid small bowel. Findings are highly concerning for a perforation of this portion of the small bowel causing pneumoperitoneum and the adjacent abscess. 3. There is new dilatation of the small bowel upstream from the inflamed left hemiabdomen small bowel concerning for small bowel obstruction due to blockage at the highly inflamed and likely perforated left hemiabdomen small bowel. 4. New mild ascites. Critical Value/emergent results were called by telephone at the time of interpretation on 07/06/2022 at 12:34 pm to provider DR. REGALADO, who verbally acknowledged these results. Electronically Signed   By: Yvonne Kendall M.D.   On: 07/06/2022 12:36    Anti-infectives: Anti-infectives (From admission, onward)    Start     Dose/Rate Route  Frequency Ordered Stop   07/02/22 0645  piperacillin-tazobactam (ZOSYN) IVPB 3.375 g  Status:  Discontinued        3.375 g 12.5 mL/hr over 240 Minutes Intravenous Every 8 hours 07/02/22 0546 07/02/22 0550   07/02/22 0200  piperacillin-tazobactam (ZOSYN) IVPB 3.375 g        3.375 g 12.5 mL/hr over 240 Minutes Intravenous Every 8 hours 07/01/22 2115      07/01/22 2015  piperacillin-tazobactam (ZOSYN) IVPB 3.375 g        3.375 g 100 mL/hr over 30 Minutes Intravenous  Once 07/01/22 2001 07/01/22 2111        Assessment/Plan Perforated diverticulitis vs small bowel perforation with left sided intra-abdominal abscess - CT 7/26: with acute diverticulitis and pneumoperitoneum thought to be secondary to perforated diverticulitis, some small bowel wall thickening in left abdomen thought to be more reactive - CT 7/31: worsening of inflammation through colon and small bowel with left hem-abdominal abscess 9.6 x 5.0 x 8.6 cm which is close to several loops of small bowel which raises concern for possible small bowel perforation, although no contrast extravasation noted - patient still having some LLQ ttp without peritonitis, not much appetite, WBC 20K  Unfortunately, Ms. Appenzeller is not responding to antibiotics and after discussion with IR does not have a reachable abscess.  I recommended diagnostic laparoscopy, possible exploratory laparotomy, possible bowel resection possible ostomy.  There is some uncertainty of the source of this intra-abdominal infection.  I explained the first portion of the procedure will be to determine the cause of this infection, the second portion would be fixing the problem.  I explained small bowel resection.  I explained Hartmann's procedure.  We discussed the surgery itself, its risks, benefits and alternatives and the patient granted consent to proceed.  We will proceed today to the operating room.  I used the iPad video Spanish interpreter service to facilitate this discussion.  FEN: NPO VTE: SCDs, ok to have SQH or LMWH from a surgical standpoint ID: Zosyn 7/26>>  - below per TRH -  HTN T2DM hypothyroidism  LOS: 6 days   I reviewed hospitalist notes, last 24 h vitals and pain scores, last 48 h intake and output, last 24 h labs and trends, last 24 h imaging results, and discussed with radiology .     Felicie Morn, [facility] 07/07/2022, 7:55 AM Please see Amion for pager number during day hours 7:00am-4:30pm

## 2022-07-08 ENCOUNTER — Encounter (HOSPITAL_COMMUNITY): Payer: Self-pay | Admitting: Surgery

## 2022-07-08 DIAGNOSIS — K5792 Diverticulitis of intestine, part unspecified, without perforation or abscess without bleeding: Secondary | ICD-10-CM

## 2022-07-08 LAB — BASIC METABOLIC PANEL
Anion gap: 9 (ref 5–15)
BUN: 5 mg/dL — ABNORMAL LOW (ref 8–23)
CO2: 23 mmol/L (ref 22–32)
Calcium: 7.7 mg/dL — ABNORMAL LOW (ref 8.9–10.3)
Chloride: 108 mmol/L (ref 98–111)
Creatinine, Ser: 0.47 mg/dL (ref 0.44–1.00)
GFR, Estimated: >60 mL/min (ref >60)
Glucose, Bld: 193 mg/dL — ABNORMAL HIGH (ref 70–99)
Potassium: 3.6 mmol/L (ref 3.5–5.1)
Sodium: 140 mmol/L (ref 135–145)

## 2022-07-08 LAB — CBC
HCT: 34.1 % — ABNORMAL LOW (ref 36.0–46.0)
Hemoglobin: 11.6 g/dL — ABNORMAL LOW (ref 12.0–15.0)
MCH: 30.9 pg (ref 26.0–34.0)
MCHC: 34 g/dL (ref 30.0–36.0)
MCV: 90.7 fL (ref 80.0–100.0)
Platelets: 413 10*3/uL — ABNORMAL HIGH (ref 150–400)
RBC: 3.76 MIL/uL — ABNORMAL LOW (ref 3.87–5.11)
RDW: 14.6 % (ref 11.5–15.5)
WBC: 25.2 10*3/uL — ABNORMAL HIGH (ref 4.0–10.5)
nRBC: 0.1 % (ref 0.0–0.2)

## 2022-07-08 LAB — GLUCOSE, CAPILLARY
Glucose-Capillary: 183 mg/dL — ABNORMAL HIGH (ref 70–99)
Glucose-Capillary: 186 mg/dL — ABNORMAL HIGH (ref 70–99)
Glucose-Capillary: 192 mg/dL — ABNORMAL HIGH (ref 70–99)
Glucose-Capillary: 170 mg/dL — ABNORMAL HIGH (ref 70–99)
Glucose-Capillary: 142 mg/dL — ABNORMAL HIGH (ref 70–99)

## 2022-07-08 MED ORDER — METHOCARBAMOL 500 MG PO TABS
1000.0000 mg | ORAL_TABLET | Freq: Three times a day (TID) | ORAL | Status: DC
  Administered 2022-07-08 – 2022-07-14 (×18): 1000 mg via ORAL
  Filled 2022-07-08 (×18): qty 2

## 2022-07-08 MED ORDER — ACETAMINOPHEN 325 MG PO TABS
650.0000 mg | ORAL_TABLET | Freq: Four times a day (QID) | ORAL | Status: DC
  Administered 2022-07-08 – 2022-07-14 (×19): 650 mg via ORAL
  Filled 2022-07-08 (×19): qty 2

## 2022-07-08 MED ORDER — ACETAMINOPHEN 650 MG RE SUPP: 650.0000 mg | Freq: Four times a day (QID) | RECTAL | Status: DC

## 2022-07-08 MED ORDER — ACETAMINOPHEN 325 MG PO TABS: 650.0000 mg | ORAL_TABLET | Freq: Four times a day (QID) | ORAL | Status: DC

## 2022-07-08 MED ORDER — HEPARIN SODIUM (PORCINE) 5000 UNIT/ML IJ SOLN
5000.0000 [IU] | Freq: Three times a day (TID) | INTRAMUSCULAR | Status: DC
  Administered 2022-07-08 – 2022-07-14 (×17): 5000 [IU] via SUBCUTANEOUS
  Filled 2022-07-08 (×17): qty 1

## 2022-07-08 NOTE — Progress Notes (Signed)
Progress Note  1 Day Post-Op  Subjective: Video interpreter used to evaluate patient. She reports sharp pain overnight but is doing a little better this AM. She was sitting up in the chair. She is asking if she will be taught how to empty/change ostomy and we discussed that [place] RN will be seeing to start working on this. She has not had any bowel function but denies nausea and is asking when she might be able to eat.   Objective: Vital signs in last 24 hours: Temp:  [98.7 F (37.1 C)-99.2 F (37.3 C)] 98.7 F (37.1 C) (08/02 0519) Pulse Rate:  [84-91] 84 (08/02 0519) Resp:  [14-22] 14 (08/02 0519) BP: (99-133)/(51-84) 99/84 (08/02 0519) SpO2:  [91 %-100 %] 91 % (08/02 0519) Weight:  [73.7 kg] 73.7 kg (08/02 0519) Last BM Date : 07/03/22  Intake/Output from previous day: 08/01 0701 - 08/02 0700 In: 3500 [I.V.:3000; IV Piggyback:500] Out: 2250 [Urine:1350; Blood:900] Intake/Output this shift: No intake/output data recorded.  PE: General: pleasant, WD, WN female who is sitting up in NAD Lungs: Respiratory effort nonlabored Abd: soft, appropriately ttp, mild distention, +BS, stoma viable with some ostomy sweat in bag, honeycomb dressing present Psych: A&Ox3 with an appropriate affect.    Lab Results:  Recent Labs    07/07/22 0453 07/08/22 0505  WBC 20.6* 25.2*  HGB 12.4 11.6*  HCT 36.7 34.1*  PLT 396 413*   BMET Recent Labs    07/07/22 0453 07/08/22 0505  NA 138 140  K 3.9 3.6  CL 106 108  CO2 23 23  GLUCOSE 133* 193*  BUN <5* 5*  CREATININE 0.40* 0.47  CALCIUM 8.0* 7.7*   PT/INR No results for input(s): "LABPROT", "INR" in the last 72 hours. CMP     Component Value Date/Time   NA 140 07/08/2022 0505   K 3.6 07/08/2022 0505   CL 108 07/08/2022 0505   CO2 23 07/08/2022 0505   GLUCOSE 193 (H) 07/08/2022 0505   BUN 5 (L) 07/08/2022 0505   CREATININE 0.47 07/08/2022 0505   CALCIUM 7.7 (L) 07/08/2022 0505   PROT 6.2 (L) 07/07/2022 0453   ALBUMIN 2.3  (L) 07/07/2022 0453   AST 29 07/07/2022 0453   ALT 25 07/07/2022 0453   ALKPHOS 57 07/07/2022 0453   BILITOT 0.7 07/07/2022 0453   GFRNONAA >60 07/08/2022 0505   Lipase     Component Value Date/Time   LIPASE 22 07/01/2022 1637       Studies/Results: CT ABDOMEN PELVIS W CONTRAST  Result Date: 07/06/2022 CLINICAL DATA:  Left lower quadrant abdominal pain. Known distal descending colon and proximal sigmoid colon diverticulitis and known free air on prior CT. EXAM: CT ABDOMEN AND PELVIS WITH CONTRAST TECHNIQUE: Multidetector CT imaging of the abdomen and pelvis was performed using the standard protocol following bolus administration of intravenous contrast. RADIATION DOSE REDUCTION: This exam was performed according to the departmental dose-optimization program which includes automated exposure control, adjustment of the mA and/or kV according to patient size and/or use of iterative reconstruction technique. CONTRAST:  135m OMNIPAQUE IOHEXOL 300 MG/ML  SOLN COMPARISON:  CT abdomen and pelvis 07/01/2022 FINDINGS: Lower chest: There is curvilinear posterior right lower lobe subsegmental atelectasis versus scarring that appears similar to prior. Posterolateral left lower lobe linear opacity and heterogeneous opacities with mild consolidation is a slightly different configuration compared to recent 07/01/2022 CT otherwise similar in size and amount. This may represent atelectasis but pneumonia is difficult to completely exclude. No pleural effusion.  Hepatobiliary: Smooth liver contours. No focal liver mass is identified. Mildly decreased density within the liver suggests fatty infiltration. 1 unchanged tiny low-density lesion within the central aspect of the liver adjacent to the IVC (axial series 2, image 15), too small to further characterize. Two gallstones are seen near the gallbladder neck, measuring up to 8 mm. No intrahepatic or extrahepatic biliary ductal dilatation. Pancreas: No mass or  inflammatory fat stranding. No pancreatic ductal dilatation is seen. Spleen: Normal in size without focal abnormality. Adrenals/Urinary Tract: Adrenal glands are unremarkable. The kidneys enhance uniformly and are symmetric in size without hydronephrosis. No renal stone is seen. No renal mass is seen. No focal urinary bladder wall thickening. Stomach/Bowel: There is now interval increase in the involvement of the distal colon with wall thickening and peripheral inflammatory fat stranding. This now involves the distal transverse colon, entire descending colon and the sigmoid colon. The transverse colon through the rectum is generally decompressed which contributes to the appearance of the bowel wall thickening. There are inflammatory changes greatest within the mid to distal descending colon again suggesting diverticulitis as seen on the prior 07/01/2022 CT. The terminal ileum is unremarkable. Normal appendix (axial series 2 images 57 through 61). Delete that delete that small sliding hiatal hernia is unchanged. There is a rim enhancing air and fluid collection within the left hemiabdomen measuring up to approximately 9.6 x 5.0 x 8.6 cm (axial series 2, images 35 through 51 and coronal images 51 through 67. This air tapers towards a loop of worsened moderate wall thickening and surrounding inflammatory fat stranding within the left hemiabdomen (coronal images 48 through 55), and it is difficult to exclude perforation of the small bowel in this region versus this free air interposed around the loops of small bowel. I favor there to be a perforation of the inflamed left hemiabdomen small bowel in this region. There are dilated loops of small bowel within the proximal small bowel of the upper abdomen measuring up to 4.2 cm with internal air-fluid levels. This appears to be small obstruction related to the worsening inflammatory fat stranding of the left hemiabdomen small bowel where there is also concern for  perforation. Vascular/Lymphatic: No abdominal aortic aneurysm. Moderate atherosclerotic calcifications. No mesenteric, retroperitoneal, or pelvic lymphadenopathy. Reproductive: The uterus is present.  No gross adnexal abnormality. Other: There is moderate free air which is likely slightly worsened compared to 07/01/2022 prior CT.New mild free fluid within the pelvis. Musculoskeletal: Mild multilevel degenerative disc changes of the lower thoracic spine. There are numerous rim calcifications within the bilateral buttocks, likely injection granulomas. IMPRESSION: Compared to 07/01/2022: 1. There is worsening of the inflammatory changes related to distal colonic diverticulitis, now involving the distal transverse colon through the midsigmoid colon. Previously this was questioned as the source for free air seen on 07/01/2022 CT. It is still unclear whether there is a perforation of the distal colonic diverticulitis, however that cannot be excluded. 2. There is an abscess within the left hemiabdomen measuring up to approximately 9.6 x 5.0 x 8.6 cm (transverse by AP by craniocaudal) with internal air-fluid level which closely apposes loops of worsened inflamed left hemiabdomen proximal to mid small bowel. Findings are highly concerning for a perforation of this portion of the small bowel causing pneumoperitoneum and the adjacent abscess. 3. There is new dilatation of the small bowel upstream from the inflamed left hemiabdomen small bowel concerning for small bowel obstruction due to blockage at the highly inflamed and likely perforated left hemiabdomen small  bowel. 4. New mild ascites. Critical Value/emergent results were called by telephone at the time of interpretation on 07/06/2022 at 12:34 pm to provider DR. REGALADO, who verbally acknowledged these results. Electronically Signed   By: Yvonne Kendall M.D.   On: 07/06/2022 12:36    Anti-infectives: Anti-infectives (From admission, onward)    Start     Dose/Rate  Route Frequency Ordered Stop   07/02/22 0645  piperacillin-tazobactam (ZOSYN) IVPB 3.375 g  Status:  Discontinued        3.375 g 12.5 mL/hr over 240 Minutes Intravenous Every 8 hours 07/02/22 0546 07/02/22 0550   07/02/22 0200  piperacillin-tazobactam (ZOSYN) IVPB 3.375 g        3.375 g 12.5 mL/hr over 240 Minutes Intravenous Every 8 hours 07/01/22 2115     07/01/22 2015  piperacillin-tazobactam (ZOSYN) IVPB 3.375 g        3.375 g 100 mL/hr over 30 Minutes Intravenous  Once 07/01/22 2001 07/01/22 2111        Assessment/Plan POD1 s/p diagnostic laparoscopic, converted to open Hartmann's Dr. Thermon Leyland - WOC consult for new ostomy  - ok to trial CLD but watch closely for ileus, significant small bowel inflammation intra-op as well  - DC foley today - PT/OT - working on pain control  - change inferior dressing BID  FEN: CLD, IVF'@100'$  cc/h VTE: SQH ID: Zosyn 7/26>>  - below per TRH -  HTN T2DM hypothyroidism  LOS: 7 days     Norm Parcel, [facility] 07/08/2022, 10:32 AM Please see Amion for pager number during day hours 7:00am-4:30pm

## 2022-07-08 NOTE — Evaluation (Signed)
Occupational Therapy Evaluation Patient Details Name: Heather Vang MRN: [id number] DOB: [birth year] Today's Date: 07/08/2022   History of Present Illness Patient is a 71 year old female who was admitted with concerns for perforated diverticulitis with abscess. Patient underwent diagnostic lapariscopy converted to open hartmann's procedure. PMH: diabetes type 2, hypertension, acquired hypothyroidism   Clinical Impression   Patient is a 71 year old female who was admitted for above. Patient was living independently at home with husband PLOF. Patient speaks spanish with interpreter used on this date. Patient was noted to be limited by pain in abdomen with pain medications on board at this time. Patient was max A for LB dressing tasks. Patient would continue to benefit from skilled OT services at this time while admitted and after d/c to address noted deficits in order to improve overall safety and independence in ADLs.        Recommendations for follow up therapy are one component of a multi-disciplinary discharge planning process, led by the attending physician.  Recommendations may be updated based on patient status, additional functional criteria and insurance authorization.   Follow Up Recommendations  Home health OT    Assistance Recommended at Discharge Frequent or constant Supervision/Assistance  Patient can return home with the following A little help with walking and/or transfers;Assistance with cooking/housework;Direct supervision/assist for financial management;A little help with bathing/dressing/bathroom;Assist for transportation;Help with stairs or ramp for entrance;Direct supervision/assist for medications management    Functional Status Assessment  Patient has had a recent decline in their functional status and demonstrates the ability to make significant improvements in function in a reasonable and predictable amount of time.  Equipment Recommendations  None recommended  by OT    Recommendations for Other Services       Precautions / Restrictions Precautions Precaution Comments: abdominal incisions, ostomy Restrictions Weight Bearing Restrictions: No      Mobility Bed Mobility Overal bed mobility: Needs Assistance Bed Mobility: Supine to Sit     Supine to sit: Mod assist     General bed mobility comments: with cues for log rolling to assist with pain in belly    Transfers                          Balance Overall balance assessment: Mild deficits observed, not formally tested                                         ADL either performed or assessed with clinical judgement   ADL Overall ADL's : Needs assistance/impaired Eating/Feeding: Set up;Sitting   Grooming: Set up;Sitting;Oral care;Wash/dry face Grooming Details (indicate cue type and reason): in recliner Upper Body Bathing: Set up;Sitting Upper Body Bathing Details (indicate cue type and reason): in recliner with cues to avoid incisions Lower Body Bathing: Maximal assistance Lower Body Bathing Details (indicate cue type and reason): unable to reach feet with pain in belly Upper Body Dressing : Set up;Sitting   Lower Body Dressing: Maximal assistance;Sit to/from stand;Sitting/lateral leans   Toilet Transfer: Minimal assistance Toilet Transfer Details (indicate cue type and reason): with HHA with increased time and cues Toileting- Clothing Manipulation and Hygiene: Maximal assistance;Sit to/from stand               Vision Baseline Vision/History: 1 Wears glasses Patient Visual Report: No change from baseline       Perception  Praxis      Pertinent Vitals/Pain Pain Assessment Pain Assessment: Faces Faces Pain Scale: Hurts whole lot Pain Location: abdomen and low back Pain Descriptors / Indicators: Discomfort, Grimacing, Operative site guarding, Guarding, Tender Pain Intervention(s): Limited activity within patient's tolerance,  Monitored during session, Premedicated before session, Repositioned     Hand Dominance Right   Extremity/Trunk Assessment Upper Extremity Assessment Upper Extremity Assessment: Overall WFL for tasks assessed   Lower Extremity Assessment Lower Extremity Assessment: Defer to PT evaluation   Cervical / Trunk Assessment Cervical / Trunk Assessment: Normal   Communication Communication Communication: No difficulties   Cognition Arousal/Alertness: Awake/alert Behavior During Therapy: WFL for tasks assessed/performed Overall Cognitive Status: Difficult to assess                                 General Comments: patient was able to follow commands well     General Comments       Exercises     Shoulder Instructions      Home Living Family/patient expects to be discharged to:: Private residence Living Arrangements: Spouse/significant other;Children Available Help at Discharge: Family;Available 24 hours/day Type of Home: House       Home Layout: One level               Home Equipment: None          Prior Functioning/Environment Prior Level of Function : Independent/Modified Independent                        OT Problem List: Decreased strength;Impaired balance (sitting and/or standing);Decreased activity tolerance;Decreased safety awareness;Cardiopulmonary status limiting activity;Decreased knowledge of precautions;Decreased knowledge of use of DME or AE;Pain      OT Treatment/Interventions: Self-care/ADL training;Therapeutic exercise;Neuromuscular education;Energy conservation;DME and/or AE instruction;Therapeutic activities;Balance training;Patient/family education    OT Goals(Current goals can be found in the care plan section) Acute Rehab OT Goals Patient Stated Goal: to get pain under control OT Goal Formulation: With patient Time For Goal Achievement: 07/22/22 Potential to Achieve Goals: Fair  OT Frequency: Min 2X/week     Co-evaluation              AM-PAC OT "6 Clicks" Daily Activity     Outcome Measure Help from another person eating meals?: A Little Help from another person taking care of personal grooming?: A Little Help from another person toileting, which includes using toliet, bedpan, or urinal?: A Lot Help from another person bathing (including washing, rinsing, drying)?: A Lot Help from another person to put on and taking off regular upper body clothing?: A Little Help from another person to put on and taking off regular lower body clothing?: A Lot 6 Click Score: 15   End of Session Equipment Utilized During Treatment: Gait belt;Rolling walker (2 wheels) Nurse Communication: Patient requests pain meds  Activity Tolerance: Patient tolerated treatment well Patient left: in chair;with call bell/phone within reach;with chair alarm set  OT Visit Diagnosis: Unsteadiness on feet (R26.81);Other abnormalities of gait and mobility (R26.89);Muscle weakness (generalized) (M62.81);Pain                Time: 1610-9604 OT Time Calculation (min): 26 min Charges:  OT General Charges $OT Visit: 1 Visit OT Evaluation $OT Eval Moderate Complexity: 1 Mod OT Treatments $Self Care/Home Management : 8-22 mins  Jackelyn Poling OTR/L, MS Acute Rehabilitation Department Office# [phone] Pager# [phone]   Marcellina Millin 07/08/2022,  12:26 PM

## 2022-07-08 NOTE — Transfer of Care (Signed)
Immediate Anesthesia Transfer of Care Note  Patient: Heather Vang  Procedure(s) Performed: Procedure(s): DIAGNOSTIC LAPAROSCOPY CONVERTED TO OPEN HARTMANN'S PROCEDURE (N/A)  Patient Location: PACU  Anesthesia Type:General  Level of Consciousness:  sedated, patient cooperative and responds to stimulation  Airway & Oxygen Therapy:Patient Spontanous Breathing and Patient connected to face mask oxgen  Post-op Assessment:  Report given to PACU RN and Post -op Vital signs reviewed and stable  Post vital signs:  Reviewed and stable  Last Vitals:  Vitals:   07/07/22 2158 07/08/22 0519  BP: 133/61 99/84  Pulse: 86 84  Resp: 16 14  Temp: 37.2 C 37.1 C  SpO2: 26% 83%    Complications: No apparent anesthesia complications

## 2022-07-08 NOTE — Consult Note (Signed)
[place] Nurse ostomy consult note Stoma type/location: LLQ, end colostomy  Stomal assessment/size: 1 1/2" round, budded, pink, moist Peristomal assessment: NA Treatment options for stomal/peristomal skin: NA Output bloody; scant Ostomy pouching: 1pc. Intact from OR; no ostomy supplies on this floor Ordered 2 3/4" 2pc and 2" skin barrier rings Education provided:  USING SPANISH INTERPRETER SERVICES Explained role of ostomy nurse and creation of stoma  Education on emptying when 1/3 to 1/2 full and how to empty Demonstrated use of wick to clean spout  Provided all educational materials in [place], customer assistance form (no insurance) Explained importance of contacting the customer assistance program for free pouches; she verbalized understanding.    Enrolled patient in [place] program: Yes  She is very anxious today with pain, requested pain meds. She has not had meds since last pm.  Sitting in chair but does have difficulty maintaining conversation.  No family in the room, however she offers to try to have one family member in the room at [place] tomorrow. Because of pain and need to work with family on pouch change will have [place] team plan visit in the am.    [place] Nurse will follow along with you for continued support with ostomy teaching and care [place] MSN, [place], [place], [place], [place]

## 2022-07-08 NOTE — Anesthesia Postprocedure Evaluation (Signed)
Anesthesia Post Note  Patient: Heather Vang  Procedure(s) Performed: DIAGNOSTIC LAPAROSCOPY CONVERTED TO OPEN HARTMANN'S PROCEDURE     Patient location during evaluation: PACU Anesthesia Type: General Level of consciousness: awake and alert Pain management: pain level controlled Vital Signs Assessment: post-procedure vital signs reviewed and stable Respiratory status: spontaneous breathing, nonlabored ventilation, respiratory function stable and patient connected to nasal cannula oxygen Cardiovascular status: blood pressure returned to baseline and stable Postop Assessment: no apparent nausea or vomiting Anesthetic complications: no   No notable events documented.  Last Vitals:  Vitals:   07/07/22 2158 07/08/22 0519  BP: 133/61 99/84  Pulse: 86 84  Resp: 16 14  Temp: 37.2 C 37.1 C  SpO2: 94% 91%    Last Pain:  Vitals:   07/08/22 0519  TempSrc: Oral  PainSc:                  Cecil Vandyke L [place]

## 2022-07-08 NOTE — Progress Notes (Signed)
[facility]  PROGRESS NOTE  Heather Vang IRC:[id number] DOB: Mar 15, 1951 DOA: 07/01/2022 PCP: Pcp, No   Brief HPI:   71 year old female with past medical history of diabetes mellitus type 2, hypertension, acquired hypothyroidism came to hospital with abdominal pain and was found to have perforated diverticulum.  CT abdomen/pelvis showed diverticulosis of the colon with pericolonic inflammatory changes in mid to distal descending colon consistent with acute diverticulitis.  Diffuse free air in the abdomen likely indicate perforated ventriculitis.  Repeat CT abdomen showed worsening inflammation and new abscess.  Patient underwent diagnostic laparoscopy which was converted to open Hartmann procedure per general surgery.    Subjective   Complains of mild pain in the left lower quadrant.   Assessment/Plan:    Descending colon perforation with abscess/perforated diverticulitis -Presented with abdominal pain, fever and leukocytosis -CT abdomen showed diverticulosis of the colon with pericolonic inflammatory changes in mid to distal descending colon consistent with acute diverticulitis, free air in the abdomen likely indicate perforated diverticulitis -Started on IV Zosyn and IV fluids -General surgery was consulted -Repeat CT scan showed worsening inflammatory changes in the bowel and new abscess with concern for small bowel perforation -Underwent diagnostic laparoscopy which was converted to open Hartmann procedure on 07/07/2022 -Wound care consulted for new ostomy -General surgery following  Diabetes mellitus type 2 -Hemoglobin A1c 6.3 -Sliding scale insulin was discontinued due to low CBG  Hypertension -Norvasc on hold due to soft blood pressure  Hypothyroidism -Continue Synthroid  Hypokalemia -Replete    Medications     acetaminophen  650 mg Oral Q6H   Chlorhexidine Gluconate Cloth  6 each Topical Daily   diclofenac Sodium  2 g Topical QID   heparin  injection (subcutaneous)  5,000 Units Subcutaneous Q8H   levothyroxine  175 mcg Oral Q0600   lidocaine  1 patch Transdermal Q24H   methocarbamol  1,000 mg Oral TID     Data Reviewed:   CBG:  Recent Labs  Lab 07/07/22 1618 07/07/22 2159 07/08/22 0731 07/08/22 1119 07/08/22 1618  GLUCAP 147* 183* 186* 192* 170*    SpO2: 94 % O2 Flow Rate (L/min): 2 L/min    Vitals:   07/07/22 1530 07/07/22 2158 07/08/22 0519 07/08/22 1411  BP: 120/63 133/61 99/84 (!) 123/55  Pulse: 84 86 84 79  Resp: '19 16 14 14  '$ Temp:  99 F (37.2 C) 98.7 F (37.1 C) 98.5 F (36.9 C)  TempSrc:  Oral Oral Oral  SpO2: 94% 94% 91% 94%  Weight:   73.7 kg   Height:          Data Reviewed:  Basic Metabolic Panel: Recent Labs  Lab 07/02/22 0033 07/02/22 5102 07/03/22 0811 07/04/22 0617 07/05/22 0603 07/06/22 0524 07/07/22 0453 07/08/22 0505  NA 137   < > 134* 137 135 137 138 140  K 3.8   < > 3.4* 3.0* 2.5* 3.2* 3.9 3.6  CL 104   < > 104 103 100 105 106 108  CO2 21*   < > 17* 19* '25 23 23 23  '$ GLUCOSE 106*   < > 141* 113* 162* 145* 133* 193*  BUN 14   < > 8 8 5* <5* <5* 5*  CREATININE 0.67   < > 0.49 0.43* 0.42* 0.40* 0.40* 0.47  CALCIUM 8.7*   < > 8.6* 8.5* 8.1* 8.1* 8.0* 7.7*  MG 2.0  --  1.8  --   --   --   --   --    < > =  values in this interval not displayed.    CBC: Recent Labs  Lab 07/02/22 0033 07/02/22 2671 07/04/22 0617 07/05/22 0603 07/06/22 0524 07/07/22 0453 07/08/22 0505  WBC 19.4*   < > 14.2* 15.9* 14.9* 20.6* 25.2*  NEUTROABS 16.6*  --   --   --   --   --   --   HGB 13.0   < > 12.9 13.3 12.0 12.4 11.6*  HCT 39.1   < > 38.6 38.8 35.9* 36.7 34.1*  MCV 93.3   < > 90.4 88.6 91.6 90.6 90.7  PLT 259   < > 330 357 303 396 413*   < > = values in this interval not displayed.    LFT Recent Labs  Lab 07/02/22 0033 07/07/22 0453  AST 12* 29  ALT 17 25  ALKPHOS 50 57  BILITOT 1.1 0.7  PROT 7.4 6.2*  ALBUMIN 3.4* 2.3*     Antibiotics: Anti-infectives (From  admission, onward)    Start     Dose/Rate Route Frequency Ordered Stop   07/02/22 0645  piperacillin-tazobactam (ZOSYN) IVPB 3.375 g  Status:  Discontinued        3.375 g 12.5 mL/hr over 240 Minutes Intravenous Every 8 hours 07/02/22 0546 07/02/22 0550   07/02/22 0200  piperacillin-tazobactam (ZOSYN) IVPB 3.375 g        3.375 g 12.5 mL/hr over 240 Minutes Intravenous Every 8 hours 07/01/22 2115     07/01/22 2015  piperacillin-tazobactam (ZOSYN) IVPB 3.375 g        3.375 g 100 mL/hr over 30 Minutes Intravenous  Once 07/01/22 2001 07/01/22 2111        DVT prophylaxis: Lovenox  Code Status: Full code  Family Communication: No family at bedside   CONSULTS General surgery   Objective    Physical Examination:   General-appears in no acute distress Heart-S1-S2, regular, no murmur auscultated Lungs-clear to auscultation bilaterally, no wheezing or crackles auscultated Abdomen-soft, nontender, no organomegaly, colostomy in place Extremities-no edema in the lower extremities Neuro-alert, oriented x3, no focal deficit noted  Status is: Inpatient: Descending colon perforation with abscess/perforated diverticulitis           [facility] If 7PM-7AM, please contact night-coverage at [email or web address], Office  [phone]   07/08/2022, 4:45 PM  LOS: 7 days

## 2022-07-08 NOTE — TOC Progression Note (Signed)
Transition of Care [facility]) - Progression Note    Patient Details  Name: Heather Vang MRN: [id number] Date of Birth: 09/09/1951  Transition of Care [facility] CM/SW Contact  Purcell Mouton, RN Phone Number: 07/08/2022, 2:56 PM  Clinical Narrative:    TOC will continue to follow for discharge needs. Pt may benefit with [facility] for New colostomy. However, pt is self pay.         Expected Discharge Plan and Services                                                 Social Determinants of Health (SDOH) Interventions    Readmission Risk Interventions     No data to display

## 2022-07-08 NOTE — Evaluation (Signed)
Physical Therapy Evaluation Patient Details Name: Heather Vang MRN: [id number] DOB: 09-Jul-1951 Today's Date: 07/08/2022  History of Present Illness  Patient is a 71 year old female who was admitted with concerns for perforated diverticulitis with abscess. Patient underwent diagnostic lapariscopy converted to open hartmann's procedure. PMH: diabetes type 2, hypertension, acquired hypothyroidism  Clinical Impression  The patient is eager to ambulate, used RW this visit. Ambulated x 220', gait steady. Patioent should progress to return home with family assistance.  Pt admitted with above diagnosis.  Pt currently with functional limitations due to the deficits listed below (see PT Problem List). Pt will benefit from skilled PT to increase their independence and safety with mobility to allow discharge to the venue listed below.           Recommendations for follow up therapy are one component of a multi-disciplinary discharge planning process, led by the attending physician.  Recommendations may be updated based on patient status, additional functional criteria and insurance authorization.  Follow Up Recommendations No PT follow up      Assistance Recommended at Discharge PRN  Patient can return home with the following  Assistance with cooking/housework;Assist for transportation;A little help with bathing/dressing/bathroom;Help with stairs or ramp for entrance    Equipment Recommendations None recommended by PT  Recommendations for Other Services       Functional Status Assessment Patient has had a recent decline in their functional status and demonstrates the ability to make significant improvements in function in a reasonable and predictable amount of time.     Precautions / Restrictions Precautions Precaution Comments: abdominal incisions, ostomy Restrictions Weight Bearing Restrictions: No      Mobility  Bed Mobility   Bed Mobility: Supine to Sit     Supine to sit:  Mod assist     General bed mobility comments: with cues for log rolling to assist with pain in belly, relied on assistance to sit upright    Transfers Overall transfer level: Needs assistance Equipment used: Rolling walker (2 wheels), None Transfers: Sit to/from Stand Sit to Stand: Min guard           General transfer comment: from bed    Ambulation/Gait Ambulation/Gait assistance: Min guard Gait Distance (Feet): 220 Feet Assistive device: Rolling walker (2 wheels) Gait Pattern/deviations: Step-through pattern Gait velocity: decr     General Gait Details: patient ambukated x ~ 10' then requested RW  Stairs            Wheelchair Mobility    Modified Rankin (Stroke Patients Only)       Balance Overall balance assessment: Mild deficits observed, not formally tested                                           Pertinent Vitals/Pain Pain Assessment Pain Score: 6  Pain Location: abdomen and low back Pain Descriptors / Indicators: Discomfort, Grimacing, Operative site guarding, Guarding, Tender Pain Intervention(s): Monitored during session    Home Living Family/patient expects to be discharged to:: Private residence Living Arrangements: Spouse/significant other;Children Available Help at Discharge: Family;Available 24 hours/day Type of Home: House         Home Layout: One level Home Equipment: None      Prior Function Prior Level of Function : Independent/Modified Independent  Hand Dominance   Dominant Hand: Right    Extremity/Trunk Assessment   Upper Extremity Assessment Upper Extremity Assessment: Overall WFL for tasks assessed    Lower Extremity Assessment Lower Extremity Assessment: Overall WFL for tasks assessed    Cervical / Trunk Assessment Cervical / Trunk Assessment: Normal  Communication   Communication: No difficulties  Cognition Arousal/Alertness: Awake/alert Behavior During  Therapy: WFL for tasks assessed/performed Overall Cognitive Status: Within Functional Limits for tasks assessed                                          General Comments      Exercises     Assessment/Plan    PT Assessment Patient needs continued PT services  PT Problem List Decreased strength;Decreased activity tolerance;Decreased mobility;Pain       PT Treatment Interventions DME instruction;Gait training;Functional mobility training;Therapeutic activities;Therapeutic exercise;Patient/family education    PT Goals (Current goals can be found in the Care Plan section)  Acute Rehab PT Goals Patient Stated Goal: to walk PT Goal Formulation: With patient/family Time For Goal Achievement: 07/22/22 Potential to Achieve Goals: Good    Frequency Min 3X/week     Co-evaluation               AM-PAC PT "6 Clicks" Mobility  Outcome Measure Help needed turning from your back to your side while in a flat bed without using bedrails?: A Lot Help needed moving from lying on your back to sitting on the side of a flat bed without using bedrails?: A Lot Help needed moving to and from a bed to a chair (including a wheelchair)?: A Little Help needed standing up from a chair using your arms (e.g., wheelchair or bedside chair)?: A Little Help needed to walk in hospital room?: A Little Help needed climbing 3-5 steps with a railing? : A Little 6 Click Score: 16    End of Session   Activity Tolerance: Patient tolerated treatment well Patient left: in chair;with call bell/phone within reach;with family/visitor present Nurse Communication: Mobility status PT Visit Diagnosis: Difficulty in walking, not elsewhere classified (R26.2);Pain    Time: 1137-1150 PT Time Calculation (min) (ACUTE ONLY): 13 min   Charges:   PT Evaluation $PT Eval Low Complexity: 1 Low            Claretha Cooper 07/08/2022, 12:30 PM

## 2022-07-09 LAB — CBC
HCT: 31.9 % — ABNORMAL LOW (ref 36.0–46.0)
Hemoglobin: 10.8 g/dL — ABNORMAL LOW (ref 12.0–15.0)
MCH: 30.9 pg (ref 26.0–34.0)
MCHC: 33.9 g/dL (ref 30.0–36.0)
MCV: 91.1 fL (ref 80.0–100.0)
Platelets: 472 10*3/uL — ABNORMAL HIGH (ref 150–400)
RBC: 3.5 MIL/uL — ABNORMAL LOW (ref 3.87–5.11)
RDW: 14.8 % (ref 11.5–15.5)
WBC: 26.6 10*3/uL — ABNORMAL HIGH (ref 4.0–10.5)
nRBC: 0.1 % (ref 0.0–0.2)

## 2022-07-09 LAB — COMPREHENSIVE METABOLIC PANEL
ALT: 19 U/L (ref 0–44)
AST: 15 U/L (ref 15–41)
Albumin: 2.3 g/dL — ABNORMAL LOW (ref 3.5–5.0)
Alkaline Phosphatase: 46 U/L (ref 38–126)
Anion gap: 7 (ref 5–15)
BUN: 6 mg/dL — ABNORMAL LOW (ref 8–23)
CO2: 24 mmol/L (ref 22–32)
Calcium: 8 mg/dL — ABNORMAL LOW (ref 8.9–10.3)
Chloride: 107 mmol/L (ref 98–111)
Creatinine, Ser: 0.4 mg/dL — ABNORMAL LOW (ref 0.44–1.00)
GFR, Estimated: >60 mL/min (ref >60)
Glucose, Bld: 169 mg/dL — ABNORMAL HIGH (ref 70–99)
Potassium: 3.6 mmol/L (ref 3.5–5.1)
Sodium: 138 mmol/L (ref 135–145)
Total Bilirubin: 0.5 mg/dL (ref 0.3–1.2)
Total Protein: 6.1 g/dL — ABNORMAL LOW (ref 6.5–8.1)

## 2022-07-09 LAB — GLUCOSE, CAPILLARY
Glucose-Capillary: 163 mg/dL — ABNORMAL HIGH (ref 70–99)
Glucose-Capillary: 158 mg/dL — ABNORMAL HIGH (ref 70–99)
Glucose-Capillary: 149 mg/dL — ABNORMAL HIGH (ref 70–99)
Glucose-Capillary: 123 mg/dL — ABNORMAL HIGH (ref 70–99)

## 2022-07-09 LAB — SURGICAL PATHOLOGY

## 2022-07-09 MED ORDER — POTASSIUM CHLORIDE CRYS ER 20 MEQ PO TBCR
40.0000 meq | EXTENDED_RELEASE_TABLET | Freq: Two times a day (BID) | ORAL | Status: AC
Start: 2022-07-09 — End: 2022-07-09
  Administered 2022-07-09 (×2): 40 meq via ORAL
  Filled 2022-07-09 (×2): qty 2

## 2022-07-09 MED ORDER — HYDROMORPHONE HCL 1 MG/ML IJ SOLN
0.5000 mg | INTRAMUSCULAR | Status: DC | PRN
  Administered 2022-07-10 – 2022-07-11 (×2): 0.5 mg via INTRAVENOUS
  Filled 2022-07-09 (×2): qty 0.5

## 2022-07-09 NOTE — Progress Notes (Signed)
[facility]  PROGRESS NOTE  Heather Vang OIN:[id number] DOB: March 21, 1951 DOA: 07/01/2022 PCP: Pcp, No   Brief HPI:   71 year old female with past medical history of diabetes mellitus type 2, hypertension, acquired hypothyroidism came to hospital with abdominal pain and was found to have perforated diverticulum.  CT abdomen/pelvis showed diverticulosis of the colon with pericolonic inflammatory changes in mid to distal descending colon consistent with acute diverticulitis.  Diffuse free air in the abdomen likely indicate perforated ventriculitis.  Repeat CT abdomen showed worsening inflammation and new abscess.  Patient underwent diagnostic laparoscopy which was converted to open Hartmann procedure per general surgery.    Subjective   Complains of mild abdominal pain.  No output from ostomy yet.   Assessment/Plan:    Descending colon perforation with abscess/perforated diverticulitis -Presented with abdominal pain, fever and leukocytosis -CT abdomen showed diverticulosis of the colon with pericolonic inflammatory changes in mid to distal descending colon consistent with acute diverticulitis, free air in the abdomen likely indicate perforated diverticulitis -Started on IV Zosyn and IV fluids -General surgery was consulted -Repeat CT scan showed worsening inflammatory changes in the bowel and new abscess with concern for small bowel perforation -Underwent diagnostic laparoscopy which was converted to open Hartmann procedure on 07/07/2022 -Wound care consulted for new ostomy -General surgery following  Diabetes mellitus type 2 -Hemoglobin A1c 6.3 -Sliding scale insulin was discontinued due to low CBG  Hypertension -Norvasc on hold due to soft blood pressure  Hypothyroidism -Continue Synthroid  Hypokalemia -Replete    Medications     acetaminophen  650 mg Oral Q6H   Chlorhexidine Gluconate Cloth  6 each Topical Daily   diclofenac Sodium  2 g Topical QID    heparin injection (subcutaneous)  5,000 Units Subcutaneous Q8H   levothyroxine  175 mcg Oral Q0600   lidocaine  1 patch Transdermal Q24H   methocarbamol  1,000 mg Oral TID   potassium chloride  40 mEq Oral BID     Data Reviewed:   CBG:  Recent Labs  Lab 07/08/22 1119 07/08/22 1618 07/08/22 2000 07/09/22 0758 07/09/22 1138  GLUCAP 192* 170* 142* 163* 158*    SpO2: 96 % O2 Flow Rate (L/min): 2 L/min    Vitals:   07/08/22 0519 07/08/22 1411 07/08/22 1958 07/09/22 0442  BP: 99/84 (!) 123/55 131/69 (!) 114/53  Pulse: 84 79 (!) 103 82  Resp: '14 14 18 18  '$ Temp: 98.7 F (37.1 C) 98.5 F (36.9 C) 98.1 F (36.7 C) 98.3 F (36.8 C)  TempSrc: Oral Oral Oral Oral  SpO2: 91% 94% 93% 96%  Weight: 73.7 kg     Height:          Data Reviewed:  Basic Metabolic Panel: Recent Labs  Lab 07/03/22 0811 07/04/22 0617 07/05/22 0603 07/06/22 0524 07/07/22 0453 07/08/22 0505 07/09/22 0538  NA 134*   < > 135 137 138 140 138  K 3.4*   < > 2.5* 3.2* 3.9 3.6 3.6  CL 104   < > 100 105 106 108 107  CO2 17*   < > '25 23 23 23 24  '$ GLUCOSE 141*   < > 162* 145* 133* 193* 169*  BUN 8   < > 5* <5* <5* 5* 6*  CREATININE 0.49   < > 0.42* 0.40* 0.40* 0.47 0.40*  CALCIUM 8.6*   < > 8.1* 8.1* 8.0* 7.7* 8.0*  MG 1.8  --   --   --   --   --   --    < > =  values in this interval not displayed.    CBC: Recent Labs  Lab 07/05/22 0603 07/06/22 0524 07/07/22 0453 07/08/22 0505 07/09/22 0538  WBC 15.9* 14.9* 20.6* 25.2* 26.6*  HGB 13.3 12.0 12.4 11.6* 10.8*  HCT 38.8 35.9* 36.7 34.1* 31.9*  MCV 88.6 91.6 90.6 90.7 91.1  PLT 357 303 396 413* 472*    LFT Recent Labs  Lab 07/07/22 0453 07/09/22 0538  AST 29 15  ALT 25 19  ALKPHOS 57 46  BILITOT 0.7 0.5  PROT 6.2* 6.1*  ALBUMIN 2.3* 2.3*     Antibiotics: Anti-infectives (From admission, onward)    Start     Dose/Rate Route Frequency Ordered Stop   07/02/22 0645  piperacillin-tazobactam (ZOSYN) IVPB 3.375 g  Status:   Discontinued        3.375 g 12.5 mL/hr over 240 Minutes Intravenous Every 8 hours 07/02/22 0546 07/02/22 0550   07/02/22 0200  piperacillin-tazobactam (ZOSYN) IVPB 3.375 g        3.375 g 12.5 mL/hr over 240 Minutes Intravenous Every 8 hours 07/01/22 2115     07/01/22 2015  piperacillin-tazobactam (ZOSYN) IVPB 3.375 g        3.375 g 100 mL/hr over 30 Minutes Intravenous  Once 07/01/22 2001 07/01/22 2111        DVT prophylaxis: Lovenox  Code Status: Full code  Family Communication: No family at bedside   CONSULTS General surgery   Objective    Physical Examination:   General-appears in no acute distress Heart-S1-S2, regular, no murmur auscultated Lungs-clear to auscultation bilaterally, no wheezing or crackles auscultated Abdomen-soft, nontender, no organomegaly, colostomy in place Extremities-no edema in the lower extremities Neuro-alert, oriented x3, no focal deficit noted  Status is: Inpatient: Descending colon perforation with abscess/perforated diverticulitis           [facility] If 7PM-7AM, please contact night-coverage at [email or web address], Office  [phone]   07/09/2022, 3:10 PM  LOS: 8 days

## 2022-07-09 NOTE — Consult Note (Signed)
[place] Nurse ostomy follow up Patient receiving care in [place] 1619.  The approved translator service was used throughout my encounter, Web designer, [phone]. Sister, niece (who is bilingual), spouse and son present for teaching. Stoma type/location: LLQ colostomy Stomal assessment/size: 1 3/4 inches round, edematous, moist, sutures intact Peristomal assessment: intact Treatment options for stomal/peristomal skin: barrier ring Output: approximatley 149m of serosanginous in existing pouch  Ostomy pouching: 2pc. 2 and 3/4 inch system. Pouch LKellie Simmering#[phone] skin barrier LKellie Simmering#2; barrier ring, LKellie Simmering#[phone] provided: Using the Interpreter service I went through the entire pouch emptying, removal, new system preparation and application processes. Thoughtful questions were asked by the patient and several family members, and were answered to their expressed satisfaction. We discussed that the pouch will need changing twice weekly, and emptying when the feces are at the level of the lettering on the pouch. [place] Yes, previously.  SVal Riles RN, MSN, CWOCN, CNS-BC, pager [phone]

## 2022-07-09 NOTE — Progress Notes (Signed)
Progress Note  2 Days Post-Op  Subjective: Family member translated for patient. Abdominal pain control improving. She ambulated in the hall 2x yesterday. She denies nausea or vomiting on CLD but is belching some. No output from stoma yet. She would like to wash her hair today.   Objective: Vital signs in last 24 hours: Temp:  [98.1 F (36.7 C)-98.5 F (36.9 C)] 98.3 F (36.8 C) (08/03 0442) Pulse Rate:  [79-103] 82 (08/03 0442) Resp:  [14-18] 18 (08/03 0442) BP: (114-131)/(53-69) 114/53 (08/03 0442) SpO2:  [93 %-96 %] 96 % (08/03 0442) Last BM Date : 07/03/22  Intake/Output from previous day: 08/02 0701 - 08/03 0700 In: 2987.8 [I.V.:2740.7; IV Piggyback:247.1] Out: 175 [Urine:75; Stool:100] Intake/Output this shift: No intake/output data recorded.  PE: General: pleasant, WD, WN female who is sitting up in NAD Lungs: Respiratory effort nonlabored Abd: soft, appropriately ttp, mild distention, +BS, stoma viable with some ostomy sweat in bag, honeycomb dressing present, drain to inferior incision with some ss fluid Psych: A&Ox3 with an appropriate affect.    Lab Results:  Recent Labs    07/08/22 0505 07/09/22 0538  WBC 25.2* 26.6*  HGB 11.6* 10.8*  HCT 34.1* 31.9*  PLT 413* 472*    BMET Recent Labs    07/08/22 0505 07/09/22 0538  NA 140 138  K 3.6 3.6  CL 108 107  CO2 23 24  GLUCOSE 193* 169*  BUN 5* 6*  CREATININE 0.47 0.40*  CALCIUM 7.7* 8.0*    PT/INR No results for input(s): "LABPROT", "INR" in the last 72 hours. CMP     Component Value Date/Time   NA 138 07/09/2022 0538   K 3.6 07/09/2022 0538   CL 107 07/09/2022 0538   CO2 24 07/09/2022 0538   GLUCOSE 169 (H) 07/09/2022 0538   BUN 6 (L) 07/09/2022 0538   CREATININE 0.40 (L) 07/09/2022 0538   CALCIUM 8.0 (L) 07/09/2022 0538   PROT 6.1 (L) 07/09/2022 0538   ALBUMIN 2.3 (L) 07/09/2022 0538   AST 15 07/09/2022 0538   ALT 19 07/09/2022 0538   ALKPHOS 46 07/09/2022 0538   BILITOT 0.5  07/09/2022 0538   GFRNONAA >60 07/09/2022 0538   Lipase     Component Value Date/Time   LIPASE 22 07/01/2022 1637       Studies/Results: No results found.  Anti-infectives: Anti-infectives (From admission, onward)    Start     Dose/Rate Route Frequency Ordered Stop   07/02/22 0645  piperacillin-tazobactam (ZOSYN) IVPB 3.375 g  Status:  Discontinued        3.375 g 12.5 mL/hr over 240 Minutes Intravenous Every 8 hours 07/02/22 0546 07/02/22 0550   07/02/22 0200  piperacillin-tazobactam (ZOSYN) IVPB 3.375 g        3.375 g 12.5 mL/hr over 240 Minutes Intravenous Every 8 hours 07/01/22 2115     07/01/22 2015  piperacillin-tazobactam (ZOSYN) IVPB 3.375 g        3.375 g 100 mL/hr over 30 Minutes Intravenous  Once 07/01/22 2001 07/01/22 2111        Assessment/Plan POD2 s/p diagnostic laparoscopic, converted to open Hartmann's Dr. Thermon Leyland - WOC following   - ok to continue CLD as tolerated but do not advance - if patient develops n/v may need NGT placement  - PT/OT - working on pain control  - change inferior dressing BID - surgical path with acute diverticulitis, no malignancy noted  FEN: CLD, decreased IVF to 50 cc/h VTE: SQH ID: Zosyn 7/26>>  -  below per TRH -  HTN T2DM hypothyroidism  LOS: 8 days     Norm Parcel, [facility] Surgery 07/09/2022, 11:59 AM Please see Amion for pager number during day hours 7:00am-4:30pm

## 2022-07-09 NOTE — TOC Progression Note (Signed)
Transition of Care [facility]) - Progression Note    Patient Details  Name: Heather Vang MRN: [id number] Date of Birth: 10-08-1951  Transition of [facility]) CM/SW Contact  Servando Snare, [place] Phone Number: 07/09/2022, 11:01 AM  Clinical Narrative:    Patient has no payor source. May be a possible barrier if patient needs HH at dc.         Expected Discharge Plan and Services                                                 Social Determinants of Health (SDOH) Interventions    Readmission Risk Interventions     No data to display

## 2022-07-09 NOTE — Plan of Care (Signed)
  Problem: Education: Goal: Knowledge of General Education information will improve Description: Including pain rating scale, medication(s)/side effects and non-pharmacologic comfort measures Outcome: Progressing   Problem: Health Behavior/Discharge Planning: Goal: Ability to manage health-related needs will improve Outcome: Progressing   Problem: Clinical Measurements: Goal: Ability to maintain clinical measurements within normal limits will improve Outcome: Progressing Goal: Will remain free from infection Outcome: Progressing   Problem: Clinical Measurements: Goal: Will remain free from infection Outcome: Progressing   

## 2022-07-10 LAB — CBC
HCT: 29.2 % — ABNORMAL LOW (ref 36.0–46.0)
Hemoglobin: 9.5 g/dL — ABNORMAL LOW (ref 12.0–15.0)
MCH: 30.3 pg (ref 26.0–34.0)
MCHC: 32.5 g/dL (ref 30.0–36.0)
MCV: 93 fL (ref 80.0–100.0)
Platelets: 467 10*3/uL — ABNORMAL HIGH (ref 150–400)
RBC: 3.14 MIL/uL — ABNORMAL LOW (ref 3.87–5.11)
RDW: 15 % (ref 11.5–15.5)
WBC: 20.4 10*3/uL — ABNORMAL HIGH (ref 4.0–10.5)
nRBC: 0.1 % (ref 0.0–0.2)

## 2022-07-10 LAB — GLUCOSE, CAPILLARY
Glucose-Capillary: 109 mg/dL — ABNORMAL HIGH (ref 70–99)
Glucose-Capillary: 133 mg/dL — ABNORMAL HIGH (ref 70–99)
Glucose-Capillary: 115 mg/dL — ABNORMAL HIGH (ref 70–99)
Glucose-Capillary: 118 mg/dL — ABNORMAL HIGH (ref 70–99)

## 2022-07-10 LAB — BASIC METABOLIC PANEL
Anion gap: 7 (ref 5–15)
BUN: <5 mg/dL — ABNORMAL LOW (ref 8–23)
CO2: 24 mmol/L (ref 22–32)
Calcium: 8.1 mg/dL — ABNORMAL LOW (ref 8.9–10.3)
Chloride: 107 mmol/L (ref 98–111)
Creatinine, Ser: 0.4 mg/dL — ABNORMAL LOW (ref 0.44–1.00)
GFR, Estimated: >60 mL/min (ref >60)
Glucose, Bld: 122 mg/dL — ABNORMAL HIGH (ref 70–99)
Potassium: 3.7 mmol/L (ref 3.5–5.1)
Sodium: 138 mmol/L (ref 135–145)

## 2022-07-10 LAB — MAGNESIUM: Magnesium: 1.8 mg/dL (ref 1.7–2.4)

## 2022-07-10 MED ORDER — METOCLOPRAMIDE HCL 5 MG/ML IJ SOLN
10.0000 mg | Freq: Four times a day (QID) | INTRAMUSCULAR | Status: DC
  Administered 2022-07-10 – 2022-07-14 (×16): 10 mg via INTRAVENOUS
  Filled 2022-07-10 (×16): qty 2

## 2022-07-10 NOTE — Progress Notes (Signed)
[facility]  PROGRESS NOTE  Anndrea Mihelich Hodges XFG:[id number] DOB: [birth year] DOA: 07/01/2022 PCP: Pcp, No   Brief HPI:   71 year old female with past medical history of diabetes mellitus type 2, hypertension, acquired hypothyroidism came to hospital with abdominal pain and was found to have perforated diverticulum.  CT abdomen/pelvis showed diverticulosis of the colon with pericolonic inflammatory changes in mid to distal descending colon consistent with acute diverticulitis.  Diffuse free air in the abdomen likely indicate perforated ventriculitis.  Repeat CT abdomen showed worsening inflammation and new abscess.  Patient underwent diagnostic laparoscopy which was converted to open Hartmann procedure per general surgery.    Subjective   Denies any abdominal pain.  No output from ostomy yet.   Assessment/Plan:    Descending colon perforation with abscess/perforated diverticulitis -Presented with abdominal pain, fever and leukocytosis -CT abdomen showed diverticulosis of the colon with pericolonic inflammatory changes in mid to distal descending colon consistent with acute diverticulitis, free air in the abdomen likely indicate perforated diverticulitis -Started on IV Zosyn and IV fluids -General surgery was consulted -Repeat CT scan showed worsening inflammatory changes in the bowel and new abscess with concern for small bowel perforation -Underwent diagnostic laparoscopy which was converted to open Hartmann procedure on 07/07/2022 -Wound care consulted for new ostomy -General surgery following -Plan for NG tube placement if patient continues to have vomiting with no output from ostomy.  Diabetes mellitus type 2 -Hemoglobin A1c 6.3 -Sliding scale insulin was discontinued due to low CBG  Hypertension -Norvasc on hold due to soft blood pressure  Hypothyroidism -Continue Synthroid  Hypokalemia -Replete    Medications     acetaminophen  650 mg Oral Q6H    Chlorhexidine Gluconate Cloth  6 each Topical Daily   diclofenac Sodium  2 g Topical QID   heparin injection (subcutaneous)  5,000 Units Subcutaneous Q8H   levothyroxine  175 mcg Oral Q0600   lidocaine  1 patch Transdermal Q24H   methocarbamol  1,000 mg Oral TID   metoCLOPramide (REGLAN) injection  10 mg Intravenous Q6H     Data Reviewed:   CBG:  Recent Labs  Lab 07/09/22 1138 07/09/22 1629 07/09/22 2156 07/10/22 0729 07/10/22 1138  GLUCAP 158* 149* 123* 109* 133*    SpO2: 92 % O2 Flow Rate (L/min): 2 L/min    Vitals:   07/09/22 1512 07/09/22 2153 07/10/22 0500 07/10/22 0630  BP: 133/67 124/62  (!) 143/67  Pulse: 78 74  76  Resp: '16 18  16  '$ Temp: 98.3 F (36.8 C) 98.4 F (36.9 C)  98.5 F (36.9 C)  TempSrc: Oral Oral  Oral  SpO2: 94% 97%  92%  Weight:   74.7 kg   Height:          Data Reviewed:  Basic Metabolic Panel: Recent Labs  Lab 07/06/22 0524 07/07/22 0453 07/08/22 0505 07/09/22 0538 07/10/22 0448  NA 137 138 140 138 138  K 3.2* 3.9 3.6 3.6 3.7  CL 105 106 108 107 107  CO2 '23 23 23 24 24  '$ GLUCOSE 145* 133* 193* 169* 122*  BUN <5* <5* 5* 6* <5*  CREATININE 0.40* 0.40* 0.47 0.40* 0.40*  CALCIUM 8.1* 8.0* 7.7* 8.0* 8.1*  MG  --   --   --   --  1.8    CBC: Recent Labs  Lab 07/06/22 0524 07/07/22 0453 07/08/22 0505 07/09/22 0538 07/10/22 0448  WBC 14.9* 20.6* 25.2* 26.6* 20.4*  HGB 12.0 12.4 11.6* 10.8* 9.5*  HCT 35.9*  36.7 34.1* 31.9* 29.2*  MCV 91.6 90.6 90.7 91.1 93.0  PLT 303 396 413* 472* 467*    LFT Recent Labs  Lab 07/07/22 0453 07/09/22 0538  AST 29 15  ALT 25 19  ALKPHOS 57 46  BILITOT 0.7 0.5  PROT 6.2* 6.1*  ALBUMIN 2.3* 2.3*     Antibiotics: Anti-infectives (From admission, onward)    Start     Dose/Rate Route Frequency Ordered Stop   07/02/22 0645  piperacillin-tazobactam (ZOSYN) IVPB 3.375 g  Status:  Discontinued        3.375 g 12.5 mL/hr over 240 Minutes Intravenous Every 8 hours 07/02/22 0546 07/02/22  0550   07/02/22 0200  piperacillin-tazobactam (ZOSYN) IVPB 3.375 g        3.375 g 12.5 mL/hr over 240 Minutes Intravenous Every 8 hours 07/01/22 2115     07/01/22 2015  piperacillin-tazobactam (ZOSYN) IVPB 3.375 g        3.375 g 100 mL/hr over 30 Minutes Intravenous  Once 07/01/22 2001 07/01/22 2111        DVT prophylaxis: Lovenox  Code Status: Full code  Family Communication: No family at bedside   CONSULTS General surgery   Objective    Physical Examination:   General-appears in no acute distress Heart-S1-S2, regular, no murmur auscultated Lungs-clear to auscultation bilaterally, no wheezing or crackles auscultated Abdomen-soft, nontender, no organomegaly, colostomy in place Extremities-no edema in the lower extremities Neuro-alert, oriented x3, no focal deficit noted  Status is: Inpatient: Descending colon perforation with abscess/perforated diverticulitis           [facility] If 7PM-7AM, please contact night-coverage at [email or web address], Office  [phone]   07/10/2022, 1:29 PM  LOS: 9 days

## 2022-07-10 NOTE — Consult Note (Signed)
[place] Nurse ostomy follow up Patient receiving care in [place] 1619. Patient's sister in room assisting her. Ostomy pouch placed yesterday intact. No output yet.  Val Riles, RN, MSN, CWOCN, CNS-BC, pager [phone]

## 2022-07-10 NOTE — Progress Notes (Signed)
Progress Note  3 Days Post-Op  Subjective: Spoke with patient with the assistance of the spanish interpreter phone number  Vomiting and nausea yesterday, followed by a lot of pain.    Has passed some gas, no bowel movements.  Objective: Vital signs in last 24 hours: Temp:  [98.3 F (36.8 C)-98.5 F (36.9 C)] 98.5 F (36.9 C) (08/04 0630) Pulse Rate:  [74-78] 76 (08/04 0630) Resp:  [16-18] 16 (08/04 0630) BP: (124-143)/(62-67) 143/67 (08/04 0630) SpO2:  [92 %-97 %] 92 % (08/04 0630) Weight:  [74.7 kg] 74.7 kg (08/04 0500) Last BM Date : 07/06/22  Intake/Output from previous day: 08/03 0701 - 08/04 0700 In: 120 [P.O.:120] Out: 301 [Urine:200; Emesis/NG output:1; Stool:100] Intake/Output this shift: No intake/output data recorded.  PE: General: pleasant, WD, WN female who is sitting up in NAD Lungs: Respiratory effort nonlabored Abd: soft, appropriately ttp, mild distention, +BS, stoma viable with some ostomy sweat in bag, honeycomb dressing present, drain to inferior incision with some ss fluid Psych: A&Ox3 with an appropriate affect.    Lab Results:  Recent Labs    07/09/22 0538 07/10/22 0448  WBC 26.6* 20.4*  HGB 10.8* 9.5*  HCT 31.9* 29.2*  PLT 472* 467*    BMET Recent Labs    07/09/22 0538 07/10/22 0448  NA 138 138  K 3.6 3.7  CL 107 107  CO2 24 24  GLUCOSE 169* 122*  BUN 6* <5*  CREATININE 0.40* 0.40*  CALCIUM 8.0* 8.1*    PT/INR No results for input(s): "LABPROT", "INR" in the last 72 hours. CMP     Component Value Date/Time   NA 138 07/10/2022 0448   K 3.7 07/10/2022 0448   CL 107 07/10/2022 0448   CO2 24 07/10/2022 0448   GLUCOSE 122 (H) 07/10/2022 0448   BUN <5 (L) 07/10/2022 0448   CREATININE 0.40 (L) 07/10/2022 0448   CALCIUM 8.1 (L) 07/10/2022 0448   PROT 6.1 (L) 07/09/2022 0538   ALBUMIN 2.3 (L) 07/09/2022 0538   AST 15 07/09/2022 0538   ALT 19 07/09/2022 0538   ALKPHOS 46 07/09/2022 0538   BILITOT 0.5 07/09/2022 0538    GFRNONAA >60 07/10/2022 0448   Lipase     Component Value Date/Time   LIPASE 22 07/01/2022 1637       Studies/Results: No results found.  Anti-infectives: Anti-infectives (From admission, onward)    Start     Dose/Rate Route Frequency Ordered Stop   07/02/22 0645  piperacillin-tazobactam (ZOSYN) IVPB 3.375 g  Status:  Discontinued        3.375 g 12.5 mL/hr over 240 Minutes Intravenous Every 8 hours 07/02/22 0546 07/02/22 0550   07/02/22 0200  piperacillin-tazobactam (ZOSYN) IVPB 3.375 g        3.375 g 12.5 mL/hr over 240 Minutes Intravenous Every 8 hours 07/01/22 2115     07/01/22 2015  piperacillin-tazobactam (ZOSYN) IVPB 3.375 g        3.375 g 100 mL/hr over 30 Minutes Intravenous  Once 07/01/22 2001 07/01/22 2111        Assessment/Plan POD2 s/p diagnostic laparoscopic, converted to open Hartmann's Dr. Thermon Leyland - WOC following   - ok to continue CLD as tolerated but do not advance - if patient develops n/v may need NGT placement  - Okay for ice chips, hard candy, and gum - PT/OT - working on pain control  - Schedule some nausea meds - change inferior dressing BID - surgical path with acute diverticulitis, no malignancy noted  FEN: CLD, decreased IVF to 86 cc/h VTE: SQH ID: Zosyn 7/26>>  - below per TRH -  HTN T2DM hypothyroidism  LOS: 9 days     Felicie Morn, MD [facility] Surgery 07/10/2022, 8:15 AM Please see Amion for pager number during day hours 7:00am-4:30pm

## 2022-07-11 ENCOUNTER — Inpatient Hospital Stay (HOSPITAL_COMMUNITY): Payer: Self-pay

## 2022-07-11 DIAGNOSIS — M7989 Other specified soft tissue disorders: Secondary | ICD-10-CM

## 2022-07-11 LAB — COMPREHENSIVE METABOLIC PANEL
ALT: 16 U/L (ref 0–44)
AST: 13 U/L — ABNORMAL LOW (ref 15–41)
Albumin: 2.3 g/dL — ABNORMAL LOW (ref 3.5–5.0)
Alkaline Phosphatase: 48 U/L (ref 38–126)
Anion gap: 10 (ref 5–15)
BUN: <5 mg/dL — ABNORMAL LOW (ref 8–23)
CO2: 22 mmol/L (ref 22–32)
Calcium: 7.9 mg/dL — ABNORMAL LOW (ref 8.9–10.3)
Chloride: 103 mmol/L (ref 98–111)
Creatinine, Ser: 0.31 mg/dL — ABNORMAL LOW (ref 0.44–1.00)
GFR, Estimated: >60 mL/min (ref >60)
Glucose, Bld: 113 mg/dL — ABNORMAL HIGH (ref 70–99)
Potassium: 3.4 mmol/L — ABNORMAL LOW (ref 3.5–5.1)
Sodium: 135 mmol/L (ref 135–145)
Total Bilirubin: 0.5 mg/dL (ref 0.3–1.2)
Total Protein: 6.3 g/dL — ABNORMAL LOW (ref 6.5–8.1)

## 2022-07-11 LAB — GLUCOSE, CAPILLARY
Glucose-Capillary: 111 mg/dL — ABNORMAL HIGH (ref 70–99)
Glucose-Capillary: 117 mg/dL — ABNORMAL HIGH (ref 70–99)
Glucose-Capillary: 113 mg/dL — ABNORMAL HIGH (ref 70–99)
Glucose-Capillary: 120 mg/dL — ABNORMAL HIGH (ref 70–99)

## 2022-07-11 LAB — CBC
HCT: 30.3 % — ABNORMAL LOW (ref 36.0–46.0)
Hemoglobin: 9.9 g/dL — ABNORMAL LOW (ref 12.0–15.0)
MCH: 30.5 pg (ref 26.0–34.0)
MCHC: 32.7 g/dL (ref 30.0–36.0)
MCV: 93.2 fL (ref 80.0–100.0)
Platelets: 571 10*3/uL — ABNORMAL HIGH (ref 150–400)
RBC: 3.25 MIL/uL — ABNORMAL LOW (ref 3.87–5.11)
RDW: 14.9 % (ref 11.5–15.5)
WBC: 17.9 10*3/uL — ABNORMAL HIGH (ref 4.0–10.5)
nRBC: 0.1 % (ref 0.0–0.2)

## 2022-07-11 MED ORDER — QUETIAPINE FUMARATE 100 MG PO TABS
100.0000 mg | ORAL_TABLET | Freq: Every day | ORAL | Status: DC
  Administered 2022-07-11 – 2022-07-13 (×3): 100 mg via ORAL
  Filled 2022-07-11 (×3): qty 1

## 2022-07-11 MED ORDER — POTASSIUM CHLORIDE 10 MEQ/100ML IV SOLN
10.0000 meq | Freq: Once | INTRAVENOUS | Status: AC
  Administered 2022-07-11: 10 meq via INTRAVENOUS
  Filled 2022-07-11: qty 100

## 2022-07-11 NOTE — Progress Notes (Signed)
Progress Note  4 Days Post-Op  Subjective: Spoke with patient with the assistance of the spanish interpreter video ipad service.  Nausea is a little better.  Has passed some gas.  Tolerating liquid diet.  No bowel movements.  Lots of abdominal pain when up and moving around.  Feels a little better today.  Objective: Vital signs in last 24 hours: Temp:  [98.2 F (36.8 C)-98.5 F (36.9 C)] 98.4 F (36.9 C) (08/05 0627) Pulse Rate:  [73-78] 78 (08/05 0627) Resp:  [14-16] 14 (08/05 0627) BP: (127-142)/(61-68) 142/68 (08/05 0627) SpO2:  [94 %-95 %] 95 % (08/05 0627) Weight:  [71.4 kg] 71.4 kg (08/05 0627) Last BM Date : 07/06/22  Intake/Output from previous day: 08/04 0701 - 08/05 0700 In: 3491.8 [I.V.:3491.8] Out: 1200 [Urine:1200] Intake/Output this shift: No intake/output data recorded.  PE: General: pleasant, WD, WN female who is sitting up in NAD Lungs: Respiratory effort nonlabored Abd: soft, appropriately ttp, mild distention, +BS, stoma viable with some ostomy sweat in bag, honeycomb dressing present, drain to inferior incision with some ss fluid Psych: A&Ox3 with an appropriate affect.    Lab Results:  Recent Labs    07/10/22 0448 07/11/22 0552  WBC 20.4* 17.9*  HGB 9.5* 9.9*  HCT 29.2* 30.3*  PLT 467* 571*    BMET Recent Labs    07/10/22 0448 07/11/22 0552  NA 138 135  K 3.7 3.4*  CL 107 103  CO2 24 22  GLUCOSE 122* 113*  BUN <5* <5*  CREATININE 0.40* 0.31*  CALCIUM 8.1* 7.9*    PT/INR No results for input(s): "LABPROT", "INR" in the last 72 hours. CMP     Component Value Date/Time   NA 135 07/11/2022 0552   K 3.4 (L) 07/11/2022 0552   CL 103 07/11/2022 0552   CO2 22 07/11/2022 0552   GLUCOSE 113 (H) 07/11/2022 0552   BUN <5 (L) 07/11/2022 0552   CREATININE 0.31 (L) 07/11/2022 0552   CALCIUM 7.9 (L) 07/11/2022 0552   PROT 6.3 (L) 07/11/2022 0552   ALBUMIN 2.3 (L) 07/11/2022 0552   AST 13 (L) 07/11/2022 0552   ALT 16 07/11/2022 0552    ALKPHOS 48 07/11/2022 0552   BILITOT 0.5 07/11/2022 0552   GFRNONAA >60 07/11/2022 0552   Lipase     Component Value Date/Time   LIPASE 22 07/01/2022 1637       Studies/Results: No results found.  Anti-infectives: Anti-infectives (From admission, onward)    Start     Dose/Rate Route Frequency Ordered Stop   07/02/22 0645  piperacillin-tazobactam (ZOSYN) IVPB 3.375 g  Status:  Discontinued        3.375 g 12.5 mL/hr over 240 Minutes Intravenous Every 8 hours 07/02/22 0546 07/02/22 0550   07/02/22 0200  piperacillin-tazobactam (ZOSYN) IVPB 3.375 g        3.375 g 12.5 mL/hr over 240 Minutes Intravenous Every 8 hours 07/01/22 2115     07/01/22 2015  piperacillin-tazobactam (ZOSYN) IVPB 3.375 g        3.375 g 100 mL/hr over 30 Minutes Intravenous  Once 07/01/22 2001 07/01/22 2111        Assessment/Plan POD2 s/p diagnostic laparoscopic, converted to open Hartmann's Dr. Thermon Leyland - WOC following   - CLD till return of bowel function - Reglan - Abdominal binder for comfort - Seroquel for sleep - Okay for ice chips, hard candy, and gum - PT/OT - change inferior dressing BID - surgical path with acute diverticulitis, no malignancy noted  FEN: CLD, decreased IVF to 65 cc/h VTE: SQH ID: Zosyn 7/26>>  - below per TRH -  HTN T2DM hypothyroidism  LOS: 10 days     Felicie Morn, MD [facility] Surgery 07/11/2022, 8:16 AM Please see Amion for pager number during day hours 7:00am-4:30pm

## 2022-07-11 NOTE — Progress Notes (Addendum)
[facility]  PROGRESS NOTE  Heather Vang MHD:[id number] DOB: 1951-01-21 DOA: 07/01/2022 PCP: Pcp, No   Brief HPI:   71 year old female with past medical history of diabetes mellitus type 2, hypertension, acquired hypothyroidism came to hospital with abdominal pain and was found to have perforated diverticulum.  CT abdomen/pelvis showed diverticulosis of the colon with pericolonic inflammatory changes in mid to distal descending colon consistent with acute diverticulitis.  Diffuse free air in the abdomen likely indicate perforated ventriculitis.  Repeat CT abdomen showed worsening inflammation and new abscess.  Patient underwent diagnostic laparoscopy which was converted to open Hartmann procedure per general surgery.    Subjective   Patient seen and examined, no output from ostomy yet.  She has swelling of left upper extremity, venous duplex of upper extremity was ordered yesterday.  Currently pending.   Assessment/Plan:    Descending colon perforation with abscess/perforated diverticulitis -Presented with abdominal pain, fever and leukocytosis -CT abdomen showed diverticulosis of the colon with pericolonic inflammatory changes in mid to distal descending colon consistent with acute diverticulitis, free air in the abdomen likely indicate perforated diverticulitis -Started on IV Zosyn and IV fluids -General surgery was consulted -Repeat CT scan showed worsening inflammatory changes in the bowel and new abscess with concern for small bowel perforation -Underwent diagnostic laparoscopy which was converted to open Hartmann procedure on 07/07/2022 -Wound care consulted for new ostomy -General surgery following -Plan for NG tube placement if patient continues to have vomiting with no output from ostomy.  Diabetes mellitus type 2 -Hemoglobin A1c 6.3 -Sliding scale insulin was discontinued due to low CBG  Left upper extremity swelling -Patient has mild upper extremity  swelling secondary to IV placement -Likely mild thrombophlebitis -Left upper extremity venous duplex was ordered yesterday  Hypertension -Norvasc on hold due to soft blood pressure  Hypothyroidism -Continue Synthroid  Hypokalemia -Potassium is 3.4 -We will replace potassium and follow BMP in am    Medications     acetaminophen  650 mg Oral Q6H   Chlorhexidine Gluconate Cloth  6 each Topical Daily   diclofenac Sodium  2 g Topical QID   heparin injection (subcutaneous)  5,000 Units Subcutaneous Q8H   levothyroxine  175 mcg Oral Q0600   lidocaine  1 patch Transdermal Q24H   methocarbamol  1,000 mg Oral TID   metoCLOPramide (REGLAN) injection  10 mg Intravenous Q6H   QUEtiapine  100 mg Oral QHS     Data Reviewed:   CBG:  Recent Labs  Lab 07/10/22 0729 07/10/22 1138 07/10/22 1646 07/10/22 2250 07/11/22 0816  GLUCAP 109* 133* 115* 118* 111*    SpO2: 95 % O2 Flow Rate (L/min): 2 L/min    Vitals:   07/10/22 0630 07/10/22 1356 07/10/22 2154 07/11/22 0627  BP: (!) 143/67 127/61 130/68 (!) 142/68  Pulse: 76 73 76 78  Resp: '16 16 14 14  '$ Temp: 98.5 F (36.9 C) 98.5 F (36.9 C) 98.2 F (36.8 C) 98.4 F (36.9 C)  TempSrc: Oral Oral Oral Oral  SpO2: 92% 94% 95% 95%  Weight:    71.4 kg  Height:          Data Reviewed:  Basic Metabolic Panel: Recent Labs  Lab 07/07/22 0453 07/08/22 0505 07/09/22 0538 07/10/22 0448 07/11/22 0552  NA 138 140 138 138 135  K 3.9 3.6 3.6 3.7 3.4*  CL 106 108 107 107 103  CO2 '23 23 24 24 22  '$ GLUCOSE 133* 193* 169* 122* 113*  BUN <5* 5*  6* <5* <5*  CREATININE 0.40* 0.47 0.40* 0.40* 0.31*  CALCIUM 8.0* 7.7* 8.0* 8.1* 7.9*  MG  --   --   --  1.8  --     CBC: Recent Labs  Lab 07/07/22 0453 07/08/22 0505 07/09/22 0538 07/10/22 0448 07/11/22 0552  WBC 20.6* 25.2* 26.6* 20.4* 17.9*  HGB 12.4 11.6* 10.8* 9.5* 9.9*  HCT 36.7 34.1* 31.9* 29.2* 30.3*  MCV 90.6 90.7 91.1 93.0 93.2  PLT 396 413* 472* 467* 571*     LFT Recent Labs  Lab 07/07/22 0453 07/09/22 0538 07/11/22 0552  AST 29 15 13*  ALT '25 19 16  '$ ALKPHOS 57 46 48  BILITOT 0.7 0.5 0.5  PROT 6.2* 6.1* 6.3*  ALBUMIN 2.3* 2.3* 2.3*     Antibiotics: Anti-infectives (From admission, onward)    Start     Dose/Rate Route Frequency Ordered Stop   07/02/22 0645  piperacillin-tazobactam (ZOSYN) IVPB 3.375 g  Status:  Discontinued        3.375 g 12.5 mL/hr over 240 Minutes Intravenous Every 8 hours 07/02/22 0546 07/02/22 0550   07/02/22 0200  piperacillin-tazobactam (ZOSYN) IVPB 3.375 g        3.375 g 12.5 mL/hr over 240 Minutes Intravenous Every 8 hours 07/01/22 2115     07/01/22 2015  piperacillin-tazobactam (ZOSYN) IVPB 3.375 g        3.375 g 100 mL/hr over 30 Minutes Intravenous  Once 07/01/22 2001 07/01/22 2111        DVT prophylaxis: Lovenox  Code Status: Full code  Family Communication: No family at bedside   CONSULTS General surgery   Objective    Physical Examination:   General-appears in no acute distress Heart-S1-S2, regular, no murmur auscultated Lungs-clear to auscultation bilaterally, no wheezing or crackles auscultated Abdomen-soft, nontender, no organomegaly Extremities-trace edema noted in the left upper extremity Neuro-alert, oriented x3, no focal deficit noted  Status is: Inpatient: Descending colon perforation with abscess/perforated diverticulitis           [facility] If 7PM-7AM, please contact night-coverage at [email or web address], Office  [phone]   07/11/2022, 11:50 AM  LOS: 10 days

## 2022-07-11 NOTE — Progress Notes (Signed)
LUE venous duplex has been completed.     Results can be found under chart review under CV PROC. 07/11/2022 5:13 PM Jessika Rothery RVT, RDMS

## 2022-07-11 NOTE — Progress Notes (Signed)
Physical Therapy Treatment Patient Details Name: Heather Vang MRN: [id number] DOB: 04-Aug-1951 Today's Date: 07/11/2022   History of Present Illness Patient is a 71 year old female who was admitted with concerns for perforated diverticulitis with abscess. Patient underwent diagnostic lapariscopy converted to open hartmann's procedure. PMH: diabetes type 2, hypertension, acquired hypothyroidism    PT Comments    Pt still reliant on RW for longer distance, but can do short distance without.  May work on progressing distance without RW.  Sharman Crate, PT tech, who speaks Spanish, was present and pt seemed appreciative of this.   Recommendations for follow up therapy are one component of a multi-disciplinary discharge planning process, led by the attending physician.  Recommendations may be updated based on patient status, additional functional criteria and insurance authorization.  Follow Up Recommendations  No PT follow up     Assistance Recommended at Discharge PRN  Patient can return home with the following Assistance with cooking/housework;Assist for transportation;A little help with bathing/dressing/bathroom;Help with stairs or ramp for entrance   Equipment Recommendations  None recommended by PT    Recommendations for Other Services       Precautions / Restrictions Precautions Precautions: None Precaution Comments: abdominal incisions, ostomy Restrictions Weight Bearing Restrictions: No     Mobility  Bed Mobility Overal bed mobility: Needs Assistance Bed Mobility: Sidelying to Sit, Sit to Sidelying   Sidelying to sit: Supervision     Sit to sidelying: Min assist General bed mobility comments: cues for log rolling    Transfers Overall transfer level: Needs assistance Equipment used: Rolling walker (2 wheels), None Transfers: Sit to/from Stand Sit to Stand: Modified independent (Device/Increase time)           General transfer comment: from bed and toilet     Ambulation/Gait Ambulation/Gait assistance: Min guard Gait Distance (Feet): 400 Feet Assistive device: Rolling walker (2 wheels) Gait Pattern/deviations: Step-through pattern Gait velocity: decr     General Gait Details: Ambulated to bathroom without AD.  Used RW for hallway ambulation.   Stairs             Wheelchair Mobility    Modified Rankin (Stroke Patients Only)       Balance                                            Cognition Arousal/Alertness: Awake/alert Behavior During Therapy: WFL for tasks assessed/performed Overall Cognitive Status: Within Functional Limits for tasks assessed                                          Exercises      General Comments General comments (skin integrity, edema, etc.): Education on bracing with pillow with coughing, sneezing, and laughing.  Abd brace has been ordered, but not yet arrived.      Pertinent Vitals/Pain Pain Assessment Pain Assessment: 0-10 Pain Score: 4  Pain Location: abdomen and low back Pain Descriptors / Indicators: Operative site guarding Pain Intervention(s): Limited activity within patient's tolerance, Monitored during session    Home Living                          Prior Function            PT  Goals (current goals can now be found in the care plan section) Acute Rehab PT Goals Patient Stated Goal: to get a good night's sleep PT Goal Formulation: With patient/family Time For Goal Achievement: 07/22/22 Potential to Achieve Goals: Good Progress towards PT goals: Progressing toward goals    Frequency    Min 3X/week      PT Plan Current plan remains appropriate    Co-evaluation              AM-PAC PT "6 Clicks" Mobility   Outcome Measure  Help needed turning from your back to your side while in a flat bed without using bedrails?: A Lot Help needed moving from lying on your back to sitting on the side of a flat bed without  using bedrails?: A Lot Help needed moving to and from a bed to a chair (including a wheelchair)?: A Little Help needed standing up from a chair using your arms (e.g., wheelchair or bedside chair)?: A Little Help needed to walk in hospital room?: A Little Help needed climbing 3-5 steps with a railing? : A Little 6 Click Score: 16    End of Session   Activity Tolerance: Patient tolerated treatment well Patient left: in bed;with call bell/phone within reach;with family/visitor present Nurse Communication: Mobility status PT Visit Diagnosis: Difficulty in walking, not elsewhere classified (R26.2);Pain     Time: 2751-7001 PT Time Calculation (min) (ACUTE ONLY): 18 min  Charges:  $Gait Training: 8-22 mins                     Cordney Barstow L. Tamala Julian, [place]  07/11/2022    Galen Manila 07/11/2022, 1:06 PM

## 2022-07-12 LAB — BASIC METABOLIC PANEL
Anion gap: 7 (ref 5–15)
BUN: <5 mg/dL — ABNORMAL LOW (ref 8–23)
CO2: 22 mmol/L (ref 22–32)
Calcium: 8 mg/dL — ABNORMAL LOW (ref 8.9–10.3)
Chloride: 110 mmol/L (ref 98–111)
Creatinine, Ser: 0.51 mg/dL (ref 0.44–1.00)
GFR, Estimated: >60 mL/min (ref >60)
Glucose, Bld: 127 mg/dL — ABNORMAL HIGH (ref 70–99)
Potassium: 3.4 mmol/L — ABNORMAL LOW (ref 3.5–5.1)
Sodium: 139 mmol/L (ref 135–145)

## 2022-07-12 LAB — GLUCOSE, CAPILLARY
Glucose-Capillary: 127 mg/dL — ABNORMAL HIGH (ref 70–99)
Glucose-Capillary: 131 mg/dL — ABNORMAL HIGH (ref 70–99)
Glucose-Capillary: 121 mg/dL — ABNORMAL HIGH (ref 70–99)
Glucose-Capillary: 122 mg/dL — ABNORMAL HIGH (ref 70–99)

## 2022-07-12 MED ORDER — POTASSIUM CHLORIDE 10 MEQ/100ML IV SOLN
10.0000 meq | Freq: Once | INTRAVENOUS | Status: AC
  Administered 2022-07-12: 10 meq via INTRAVENOUS
  Filled 2022-07-12: qty 100

## 2022-07-12 NOTE — Progress Notes (Signed)
Progress Note  5 Days Post-Op  Subjective: Spoke with patient with the assistance of the spanish interpreter video ipad service.  Gas and stool in ostomy.  No nausea.  Pain when up moving around.  Objective: Vital signs in last 24 hours: Temp:  [98.4 F (36.9 C)-99.1 F (37.3 C)] 98.5 F (36.9 C) (08/06 0547) Pulse Rate:  [74-86] 86 (08/06 0547) Resp:  [14-20] 14 (08/06 0547) BP: (109-131)/(58-70) 131/58 (08/06 0547) SpO2:  [95 %-96 %] 95 % (08/06 0547) Weight:  [71.2 kg] 71.2 kg (08/06 0547) Last BM Date : 07/06/22  Intake/Output from previous day: 08/05 0701 - 08/06 0700 In: 907.1 [P.O.:807; IV Piggyback:100.1] Out: -  Intake/Output this shift: No intake/output data recorded.  PE: General: pleasant, WD, WN female who is sitting up in NAD Lungs: Respiratory effort nonlabored Abd: soft, appropriately ttp, mild distention, +BS, stoma viable with gas and stool in bag, honeycomb dressing present, drain to inferior incision with some ss fluid Psych: A&Ox3 with an appropriate affect.    Lab Results:  Recent Labs    07/10/22 0448 07/11/22 0552  WBC 20.4* 17.9*  HGB 9.5* 9.9*  HCT 29.2* 30.3*  PLT 467* 571*    BMET Recent Labs    07/11/22 0552 07/12/22 0626  NA 135 139  K 3.4* 3.4*  CL 103 110  CO2 22 22  GLUCOSE 113* 127*  BUN <5* <5*  CREATININE 0.31* 0.51  CALCIUM 7.9* 8.0*    PT/INR No results for input(s): "LABPROT", "INR" in the last 72 hours. CMP     Component Value Date/Time   NA 139 07/12/2022 0626   K 3.4 (L) 07/12/2022 0626   CL 110 07/12/2022 0626   CO2 22 07/12/2022 0626   GLUCOSE 127 (H) 07/12/2022 0626   BUN <5 (L) 07/12/2022 0626   CREATININE 0.51 07/12/2022 0626   CALCIUM 8.0 (L) 07/12/2022 0626   PROT 6.3 (L) 07/11/2022 0552   ALBUMIN 2.3 (L) 07/11/2022 0552   AST 13 (L) 07/11/2022 0552   ALT 16 07/11/2022 0552   ALKPHOS 48 07/11/2022 0552   BILITOT 0.5 07/11/2022 0552   GFRNONAA >60 07/12/2022 0626   Lipase      Component Value Date/Time   LIPASE 22 07/01/2022 1637       Studies/Results: VAS [place] UPPER EXTREMITY VENOUS DUPLEX  Result Date: 07/11/2022 UPPER VENOUS STUDY  Patient Name:  Heather Vang [facility]  Date of Exam:   07/11/2022 Medical Rec #: [id number]                Accession #:    [id number] Date of Birth: 08-Jul-1951                 Patient Gender: F Patient Age:   71 years Exam Location:  [facility] Procedure:      VAS [place] UPPER EXTREMITY VENOUS DUPLEX Referring Phys: Frederich Chick LAMA --------------------------------------------------------------------------------  Indications: Swelling Risk Factors: Recent IV placement. Performing Technologist: Rogelia Rohrer RVT, RDMS  Examination Guidelines: A complete evaluation includes B-mode imaging, spectral Doppler, color Doppler, and power Doppler as needed of all accessible portions of each vessel. Bilateral testing is considered an integral part of a complete examination. Limited examinations for reoccurring indications may be performed as noted.  Right Findings: +----------+------------+---------+-----------+----------+-------+ RIGHT     CompressiblePhasicitySpontaneousPropertiesSummary +----------+------------+---------+-----------+----------+-------+ Subclavian    Full       Yes       Yes                      +----------+------------+---------+-----------+----------+-------+  Left Findings: +----------+------------+---------+-----------+----------+-------+ LEFT      CompressiblePhasicitySpontaneousPropertiesSummary +----------+------------+---------+-----------+----------+-------+ IJV           Full       Yes       Yes                      +----------+------------+---------+-----------+----------+-------+ Subclavian    Full       Yes       Yes                      +----------+------------+---------+-----------+----------+-------+ Axillary      Full       Yes       Yes                       +----------+------------+---------+-----------+----------+-------+ Brachial      Full       Yes       Yes                      +----------+------------+---------+-----------+----------+-------+ Radial        Full                                          +----------+------------+---------+-----------+----------+-------+ Ulnar         Full                                          +----------+------------+---------+-----------+----------+-------+ Cephalic      Full                                          +----------+------------+---------+-----------+----------+-------+ Basilic       Full       Yes       Yes                      +----------+------------+---------+-----------+----------+-------+ Only one of paired brachial veins visualized on this exam.  Summary:  Right: No evidence of thrombosis in the subclavian.  Left: No evidence of deep vein thrombosis in the upper extremity. No evidence of superficial vein thrombosis in the upper extremity.  *See table(s) above for measurements and observations.  Diagnosing physician: Deitra Mayo MD Electronically signed by Deitra Mayo MD on 07/11/2022 at 7:03:25 PM.    Final     Anti-infectives: Anti-infectives (From admission, onward)    Start     Dose/Rate Route Frequency Ordered Stop   07/02/22 0645  piperacillin-tazobactam (ZOSYN) IVPB 3.375 g  Status:  Discontinued        3.375 g 12.5 mL/hr over 240 Minutes Intravenous Every 8 hours 07/02/22 0546 07/02/22 0550   07/02/22 0200  piperacillin-tazobactam (ZOSYN) IVPB 3.375 g        3.375 g 12.5 mL/hr over 240 Minutes Intravenous Every 8 hours 07/01/22 2115     07/01/22 2015  piperacillin-tazobactam (ZOSYN) IVPB 3.375 g        3.375 g 100 mL/hr over 30 Minutes Intravenous  Once 07/01/22 2001 07/01/22 2111        Assessment/Plan POD2 s/p diagnostic laparoscopic, converted to open Hartmann's Dr. Thermon Leyland - WOC following   -  FLD, ADAT - Reglan - Abdominal  binder for comfort - Seroquel for sleep - PT/OT - TOC consult for ostomy supplies - change inferior dressing BID - Remove penrose drain on day of discharge - surgical path with acute diverticulitis, no malignancy noted  Possibly home Monday or Tuesday  FEN: FLD ADAT VTE: SQH ID: Zosyn 7/26>>  - below per TRH -  HTN T2DM hypothyroidism  LOS: 11 days     Felicie Morn, [facility] 07/12/2022, 8:10 AM Please see Amion for pager number during day hours 7:00am-4:30pm

## 2022-07-12 NOTE — Progress Notes (Signed)
[facility]  PROGRESS NOTE  Heather Vang SVX:[id number] DOB: 10-Oct-1951 DOA: 07/01/2022 PCP: Pcp, No   Brief HPI:   72 year old female with past medical history of diabetes mellitus type 2, hypertension, acquired hypothyroidism came to hospital with abdominal pain and was found to have perforated diverticulum.  CT abdomen/pelvis showed diverticulosis of the colon with pericolonic inflammatory changes in mid to distal descending colon consistent with acute diverticulitis.  Diffuse free air in the abdomen likely indicate perforated ventriculitis.  Repeat CT abdomen showed worsening inflammation and new abscess.  Patient underwent diagnostic laparoscopy which was converted to open Hartmann procedure per general surgery.    Subjective   Patient seen and examined, denies any complaints.  Stool noted in the ostomy bag.   Assessment/Plan:    Descending colon perforation with abscess/perforated diverticulitis -Presented with abdominal pain, fever and leukocytosis -CT abdomen showed diverticulosis of the colon with pericolonic inflammatory changes in mid to distal descending colon consistent with acute diverticulitis, free air in the abdomen likely indicate perforated diverticulitis -Started on IV Zosyn and IV fluids -General surgery was consulted -Repeat CT scan showed worsening inflammatory changes in the bowel and new abscess with concern for small bowel perforation -Underwent diagnostic laparoscopy which was converted to open Hartmann procedure on 07/07/2022 -Wound care consulted for new ostomy -Stool noted in the ostomy bag -Patient started on full liquid diet -General surgery following  Diabetes mellitus type 2 -Hemoglobin A1c 6.3 -Sliding scale insulin was discontinued due to low CBG  Left upper extremity swelling -Patient has mild upper extremity swelling secondary to IV placement -Likely mild thrombophlebitis -Left upper extremity venous duplex was ordered  yesterday  Hypertension -Norvasc on hold due to soft blood pressure  Hypothyroidism -Continue Synthroid  Hypokalemia -Potassium is 3.4 -We will replace potassium and follow BMP in am    Medications     acetaminophen  650 mg Oral Q6H   Chlorhexidine Gluconate Cloth  6 each Topical Daily   diclofenac Sodium  2 g Topical QID   heparin injection (subcutaneous)  5,000 Units Subcutaneous Q8H   levothyroxine  175 mcg Oral Q0600   lidocaine  1 patch Transdermal Q24H   methocarbamol  1,000 mg Oral TID   metoCLOPramide (REGLAN) injection  10 mg Intravenous Q6H   QUEtiapine  100 mg Oral QHS     Data Reviewed:   CBG:  Recent Labs  Lab 07/11/22 1157 07/11/22 1657 07/11/22 2136 07/12/22 0811 07/12/22 1204  GLUCAP 117* 113* 120* 127* 131*    SpO2: 95 % O2 Flow Rate (L/min): 2 L/min    Vitals:   07/11/22 0627 07/11/22 1352 07/11/22 2135 07/12/22 0547  BP: (!) 142/68 123/70 109/65 (!) 131/58  Pulse: 78 74 74 86  Resp: '14 20 14 14  '$ Temp: 98.4 F (36.9 C) 98.4 F (36.9 C) 99.1 F (37.3 C) 98.5 F (36.9 C)  TempSrc: Oral Oral Oral Oral  SpO2: 95% 96% 95% 95%  Weight: 71.4 kg   71.2 kg  Height:          Data Reviewed:  Basic Metabolic Panel: Recent Labs  Lab 07/08/22 0505 07/09/22 0538 07/10/22 0448 07/11/22 0552 07/12/22 0626  NA 140 138 138 135 139  K 3.6 3.6 3.7 3.4* 3.4*  CL 108 107 107 103 110  CO2 '23 24 24 22 22  '$ GLUCOSE 193* 169* 122* 113* 127*  BUN 5* 6* <5* <5* <5*  CREATININE 0.47 0.40* 0.40* 0.31* 0.51  CALCIUM 7.7* 8.0* 8.1* 7.9* 8.0*  MG  --   --  1.8  --   --     CBC: Recent Labs  Lab 07/07/22 0453 07/08/22 0505 07/09/22 0538 07/10/22 0448 07/11/22 0552  WBC 20.6* 25.2* 26.6* 20.4* 17.9*  HGB 12.4 11.6* 10.8* 9.5* 9.9*  HCT 36.7 34.1* 31.9* 29.2* 30.3*  MCV 90.6 90.7 91.1 93.0 93.2  PLT 396 413* 472* 467* 571*    LFT Recent Labs  Lab 07/07/22 0453 07/09/22 0538 07/11/22 0552  AST 29 15 13*  ALT '25 19 16  '$ ALKPHOS 57  46 48  BILITOT 0.7 0.5 0.5  PROT 6.2* 6.1* 6.3*  ALBUMIN 2.3* 2.3* 2.3*     Antibiotics: Anti-infectives (From admission, onward)    Start     Dose/Rate Route Frequency Ordered Stop   07/02/22 0645  piperacillin-tazobactam (ZOSYN) IVPB 3.375 g  Status:  Discontinued        3.375 g 12.5 mL/hr over 240 Minutes Intravenous Every 8 hours 07/02/22 0546 07/02/22 0550   07/02/22 0200  piperacillin-tazobactam (ZOSYN) IVPB 3.375 g        3.375 g 12.5 mL/hr over 240 Minutes Intravenous Every 8 hours 07/01/22 2115     07/01/22 2015  piperacillin-tazobactam (ZOSYN) IVPB 3.375 g        3.375 g 100 mL/hr over 30 Minutes Intravenous  Once 07/01/22 2001 07/01/22 2111        DVT prophylaxis: Lovenox  Code Status: Full code  Family Communication: No family at bedside   CONSULTS General surgery   Objective    Physical Examination:   General-appears in no acute distress Heart-S1-S2, regular, no murmur auscultated Lungs-clear to auscultation bilaterally, no wheezing or crackles auscultated Abdomen-soft, nontender, no organomegaly, colostomy in place Extremities-no edema in the lower extremities Neuro-alert, oriented x3, no focal deficit noted  Status is: Inpatient: Descending colon perforation with abscess/perforated diverticulitis           [place] If 7PM-7AM, please contact night-coverage at [email or web address], Office  [phone]   07/12/2022, 2:20 PM  LOS: 11 days

## 2022-07-13 LAB — GLUCOSE, CAPILLARY
Glucose-Capillary: 113 mg/dL — ABNORMAL HIGH (ref 70–99)
Glucose-Capillary: 123 mg/dL — ABNORMAL HIGH (ref 70–99)
Glucose-Capillary: 112 mg/dL — ABNORMAL HIGH (ref 70–99)
Glucose-Capillary: 121 mg/dL — ABNORMAL HIGH (ref 70–99)

## 2022-07-13 LAB — CBC
HCT: 28.8 % — ABNORMAL LOW (ref 36.0–46.0)
Hemoglobin: 9.4 g/dL — ABNORMAL LOW (ref 12.0–15.0)
MCH: 30.4 pg (ref 26.0–34.0)
MCHC: 32.6 g/dL (ref 30.0–36.0)
MCV: 93.2 fL (ref 80.0–100.0)
Platelets: 652 10*3/uL — ABNORMAL HIGH (ref 150–400)
RBC: 3.09 MIL/uL — ABNORMAL LOW (ref 3.87–5.11)
RDW: 15 % (ref 11.5–15.5)
WBC: 13.7 10*3/uL — ABNORMAL HIGH (ref 4.0–10.5)
nRBC: 0 % (ref 0.0–0.2)

## 2022-07-13 LAB — BASIC METABOLIC PANEL
Anion gap: 8 (ref 5–15)
BUN: <5 mg/dL — ABNORMAL LOW (ref 8–23)
CO2: 22 mmol/L (ref 22–32)
Calcium: 8.2 mg/dL — ABNORMAL LOW (ref 8.9–10.3)
Chloride: 109 mmol/L (ref 98–111)
Creatinine, Ser: 0.43 mg/dL — ABNORMAL LOW (ref 0.44–1.00)
GFR, Estimated: >60 mL/min (ref >60)
Glucose, Bld: 118 mg/dL — ABNORMAL HIGH (ref 70–99)
Potassium: 3.7 mmol/L (ref 3.5–5.1)
Sodium: 139 mmol/L (ref 135–145)

## 2022-07-13 NOTE — Progress Notes (Signed)
Progress Note  6 Days Post-Op  Subjective: Spoke with patient with the assistance of the spanish interpreter video ipad service.  Doing well.  No pain if laying still, only when up and moving.  Tolerating liquids.  Passing gas and stool.  Objective: Vital signs in last 24 hours: Temp:  [98 F (36.7 C)-98.6 F (37 C)] 98 F (36.7 C) (08/07 0539) Pulse Rate:  [75-78] 78 (08/07 0539) Resp:  [14-16] 14 (08/07 0539) BP: (102-125)/(55-70) 117/69 (08/07 0539) SpO2:  [95 %-97 %] 96 % (08/07 0539) Weight:  [73.6 kg] 73.6 kg (08/07 0539) Last BM Date : 07/12/22  Intake/Output from previous day: 08/06 0701 - 08/07 0700 In: -  Out: 200 [Stool:200] Intake/Output this shift: No intake/output data recorded.  PE: General: pleasant, WD, WN female who is sitting up in NAD Lungs: Respiratory effort nonlabored Abd: soft, appropriately ttp, mild distention, +BS, stoma viable with gas and stool in bag, honeycomb dressing present, drain to inferior incision with some ss fluid Psych: A&Ox3 with an appropriate affect.    Lab Results:  Recent Labs    07/11/22 0552 07/13/22 0445  WBC 17.9* 13.7*  HGB 9.9* 9.4*  HCT 30.3* 28.8*  PLT 571* 652*    BMET Recent Labs    07/12/22 0626 07/13/22 0445  NA 139 139  K 3.4* 3.7  CL 110 109  CO2 22 22  GLUCOSE 127* 118*  BUN <5* <5*  CREATININE 0.51 0.43*  CALCIUM 8.0* 8.2*    PT/INR No results for input(s): "LABPROT", "INR" in the last 72 hours. CMP     Component Value Date/Time   NA 139 07/13/2022 0445   K 3.7 07/13/2022 0445   CL 109 07/13/2022 0445   CO2 22 07/13/2022 0445   GLUCOSE 118 (H) 07/13/2022 0445   BUN <5 (L) 07/13/2022 0445   CREATININE 0.43 (L) 07/13/2022 0445   CALCIUM 8.2 (L) 07/13/2022 0445   PROT 6.3 (L) 07/11/2022 0552   ALBUMIN 2.3 (L) 07/11/2022 0552   AST 13 (L) 07/11/2022 0552   ALT 16 07/11/2022 0552   ALKPHOS 48 07/11/2022 0552   BILITOT 0.5 07/11/2022 0552   GFRNONAA >60 07/13/2022 0445    Lipase     Component Value Date/Time   LIPASE 22 07/01/2022 1637       Studies/Results: VAS [place] UPPER EXTREMITY VENOUS DUPLEX  Result Date: 07/11/2022 UPPER VENOUS STUDY  Patient Name:  Heather Heather Vang [facility]  Date of Exam:   07/11/2022 Medical Rec #: [id number]                Accession #:    [id number] Date of Birth: 01-07-1951                 Patient Gender: F Patient Age:   71 years Exam Location:  [facility] Procedure:      VAS [place] UPPER EXTREMITY VENOUS DUPLEX Referring Phys: Heather Heather Vang --------------------------------------------------------------------------------  Indications: Swelling Risk Factors: Recent IV placement. Performing Technologist: Heather Heather Vang RVT, RDMS  Examination Guidelines: A complete evaluation includes B-mode imaging, spectral Doppler, color Doppler, and power Doppler as needed of all accessible portions of each vessel. Bilateral testing is considered an integral part of a complete examination. Limited examinations for reoccurring indications may be performed as noted.  Right Findings: +----------+------------+---------+-----------+----------+-------+ RIGHT     CompressiblePhasicitySpontaneousPropertiesSummary +----------+------------+---------+-----------+----------+-------+ Subclavian    Full       Yes       Yes                      +----------+------------+---------+-----------+----------+-------+  Left Findings: +----------+------------+---------+-----------+----------+-------+ LEFT      CompressiblePhasicitySpontaneousPropertiesSummary +----------+------------+---------+-----------+----------+-------+ IJV           Full       Yes       Yes                      +----------+------------+---------+-----------+----------+-------+ Subclavian    Full       Yes       Yes                      +----------+------------+---------+-----------+----------+-------+ Axillary      Full       Yes       Yes                       +----------+------------+---------+-----------+----------+-------+ Brachial      Full       Yes       Yes                      +----------+------------+---------+-----------+----------+-------+ Radial        Full                                          +----------+------------+---------+-----------+----------+-------+ Ulnar         Full                                          +----------+------------+---------+-----------+----------+-------+ Cephalic      Full                                          +----------+------------+---------+-----------+----------+-------+ Basilic       Full       Yes       Yes                      +----------+------------+---------+-----------+----------+-------+ Only one of paired brachial veins visualized on this exam.  Summary:  Right: No evidence of thrombosis in the subclavian.  Left: No evidence of deep vein thrombosis in the upper extremity. No evidence of superficial vein thrombosis in the upper extremity.  *See table(s) above for measurements and observations.  Diagnosing physician: Heather Mayo MD Electronically signed by Heather Mayo MD on 07/11/2022 at 7:03:25 PM.    Final     Anti-infectives: Anti-infectives (From admission, onward)    Start     Dose/Rate Route Frequency Ordered Stop   07/02/22 0645  piperacillin-tazobactam (ZOSYN) IVPB 3.375 g  Status:  Discontinued        3.375 g 12.5 mL/hr over 240 Minutes Intravenous Every 8 hours 07/02/22 0546 07/02/22 0550   07/02/22 0200  piperacillin-tazobactam (ZOSYN) IVPB 3.375 g        3.375 g 12.5 mL/hr over 240 Minutes Intravenous Every 8 hours 07/01/22 2115     07/01/22 2015  piperacillin-tazobactam (ZOSYN) IVPB 3.375 g        3.375 g 100 mL/hr over 30 Minutes Intravenous  Once 07/01/22 2001 07/01/22 2111        Assessment/Plan Heather Heather Vang presented with diverticulitis with abscess that did not respond to antibiotics so on 8/1,  she underwent diagnostic  laparoscopic, converted to open Heather Vang's Dr. Thermon Heather Vang - WOC following   - Regular diet - Abdominal binder for comfort - Seroquel for sleep - PT/OT - TOC consult for ostomy supplies - change inferior dressing BID - Remove penrose drain on day of discharge - surgical path with acute diverticulitis, no malignancy noted  Regular diet, ostomy care teaching today.  Pretend like she is at home.  If she does well today, discharge home tomorrow  FEN: Regular diet VTE: SQH ID: Zosyn 7/26>> 8/8 (I would continue zosyn till tomorrow)  - below per TRH -  HTN T2DM hypothyroidism  LOS: 12 days     Felicie Morn, MD [facility] 07/13/2022, 7:35 AM Please see Amion for pager number during day hours 7:00am-4:30pm

## 2022-07-13 NOTE — Progress Notes (Signed)
PT Cancellation Note  Patient Details Name: Heather Vang MRN: [id number] DOB: [birth year]   Cancelled Treatment:    Reason Eval/Treat Not Completed: Attempted PT tx session-pt politely declined.She requested PT check back another time.    [facility]  Office: [phone] Pager: [phone]

## 2022-07-13 NOTE — Progress Notes (Signed)
[facility]  PROGRESS NOTE  Heather Vang PZW:[id number] DOB: Apr 17, 1951 DOA: 07/01/2022 PCP: Pcp, No   Brief HPI:   71 year old female with past medical history of diabetes mellitus type 2, hypertension, acquired hypothyroidism came to hospital with abdominal pain and was found to have perforated diverticulum.  CT abdomen/pelvis showed diverticulosis of the colon with pericolonic inflammatory changes in mid to distal descending colon consistent with acute diverticulitis.  Diffuse free air in the abdomen likely indicate perforated ventriculitis.  Repeat CT abdomen showed worsening inflammation and new abscess.  Patient underwent diagnostic laparoscopy which was converted to open Hartmann procedure per general surgery.    Subjective   Patient seen and examined, stool in ostomy bag.  Started on regular diet per general surgery.   Assessment/Plan:    Descending colon perforation with abscess/perforated diverticulitis -Presented with abdominal pain, fever and leukocytosis -CT abdomen showed diverticulosis of the colon with pericolonic inflammatory changes in mid to distal descending colon consistent with acute diverticulitis, free air in the abdomen likely indicate perforated diverticulitis -Started on IV Zosyn and IV fluids; will stop antibiotics after tomorrow's dose as per recommendation by general surgery -General surgery was consulted -Repeat CT scan showed worsening inflammatory changes in the bowel and new abscess with concern for small bowel perforation -Underwent diagnostic laparoscopy which was converted to open Hartmann procedure on 07/07/2022 -Stool noted in the ostomy bag; ostomy nurse following -Patient started on regular diet -General surgery following -Likely discharge home in a.m.  Diabetes mellitus type 2 -Hemoglobin A1c 6.3 -Sliding scale insulin was discontinued due to low CBG  Left upper extremity swelling -Patient has mild upper extremity swelling  secondary to IV placement -Likely mild thrombophlebitis -Left upper extremity venous duplex was ordered which was negative for DVT  Hypertension -Norvasc on hold due to soft blood pressure  Hypothyroidism -Continue Synthroid  Hypokalemia -Replete    Medications     acetaminophen  650 mg Oral Q6H   Chlorhexidine Gluconate Cloth  6 each Topical Daily   diclofenac Sodium  2 g Topical QID   heparin injection (subcutaneous)  5,000 Units Subcutaneous Q8H   levothyroxine  175 mcg Oral Q0600   lidocaine  1 patch Transdermal Q24H   methocarbamol  1,000 mg Oral TID   metoCLOPramide (REGLAN) injection  10 mg Intravenous Q6H   QUEtiapine  100 mg Oral QHS     Data Reviewed:   CBG:  Recent Labs  Lab 07/12/22 0811 07/12/22 1204 07/12/22 1732 07/12/22 2207 07/13/22 0834  GLUCAP 127* 131* 121* 122* 113*    SpO2: 96 % O2 Flow Rate (L/min): 2 L/min    Vitals:   07/12/22 0547 07/12/22 1420 07/12/22 2206 07/13/22 0539  BP: (!) 131/58 (!) 125/55 102/70 117/69  Pulse: 86 75 78 78  Resp: '14 16 14 14  '$ Temp: 98.5 F (36.9 C)  98.6 F (37 C) 98 F (36.7 C)  TempSrc: Oral  Oral Oral  SpO2: 95% 97% 95% 96%  Weight: 71.2 kg   73.6 kg  Height:          Data Reviewed:  Basic Metabolic Panel: Recent Labs  Lab 07/09/22 0538 07/10/22 0448 07/11/22 0552 07/12/22 0626 07/13/22 0445  NA 138 138 135 139 139  K 3.6 3.7 3.4* 3.4* 3.7  CL 107 107 103 110 109  CO2 '24 24 22 22 22  '$ GLUCOSE 169* 122* 113* 127* 118*  BUN 6* <5* <5* <5* <5*  CREATININE 0.40* 0.40* 0.31* 0.51 0.43*  CALCIUM  8.0* 8.1* 7.9* 8.0* 8.2*  MG  --  1.8  --   --   --     CBC: Recent Labs  Lab 07/08/22 0505 07/09/22 0538 07/10/22 0448 07/11/22 0552 07/13/22 0445  WBC 25.2* 26.6* 20.4* 17.9* 13.7*  HGB 11.6* 10.8* 9.5* 9.9* 9.4*  HCT 34.1* 31.9* 29.2* 30.3* 28.8*  MCV 90.7 91.1 93.0 93.2 93.2  PLT 413* 472* 467* 571* 652*    LFT Recent Labs  Lab 07/07/22 0453 07/09/22 0538 07/11/22 0552   AST 29 15 13*  ALT '25 19 16  '$ ALKPHOS 57 46 48  BILITOT 0.7 0.5 0.5  PROT 6.2* 6.1* 6.3*  ALBUMIN 2.3* 2.3* 2.3*     Antibiotics: Anti-infectives (From admission, onward)    Start     Dose/Rate Route Frequency Ordered Stop   07/02/22 0645  piperacillin-tazobactam (ZOSYN) IVPB 3.375 g  Status:  Discontinued        3.375 g 12.5 mL/hr over 240 Minutes Intravenous Every 8 hours 07/02/22 0546 07/02/22 0550   07/02/22 0200  piperacillin-tazobactam (ZOSYN) IVPB 3.375 g        3.375 g 12.5 mL/hr over 240 Minutes Intravenous Every 8 hours 07/01/22 2115     07/01/22 2015  piperacillin-tazobactam (ZOSYN) IVPB 3.375 g        3.375 g 100 mL/hr over 30 Minutes Intravenous  Once 07/01/22 2001 07/01/22 2111        DVT prophylaxis: Lovenox  Code Status: Full code  Family Communication: No family at bedside   CONSULTS General surgery   Objective    Physical Examination:   General-appears in no acute distress Heart-S1-S2, regular, no murmur auscultated Lungs-clear to auscultation bilaterally, no wheezing or crackles auscultated Abdomen-soft, nontender, no organomegaly, 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

## 2022-07-13 NOTE — Discharge Instructions (Signed)
[facility]      [facility] °[phone] ° °OPEN ABDOMINAL SURGERY: POST OP INSTRUCTIONS ° °Always review your discharge instruction sheet given to you by the facility where your surgery was performed. ° °IF YOU HAVE DISABILITY OR FAMILY LEAVE FORMS, YOU MUST BRING THEM TO THE OFFICE FOR PROCESSING.  PLEASE DO NOT GIVE THEM TO YOUR DOCTOR. ° °A prescription for pain medication may be given to you upon discharge.  Take your pain medication as prescribed, if needed.  If narcotic pain medicine is not needed, then you may take acetaminophen (Tylenol) or ibuprofen (Advil) as needed. °Take your usually prescribed medications unless otherwise directed. °If you need a refill on your pain medication, please contact your pharmacy. They will contact our office to request authorization.  Prescriptions will not be filled after 5pm or on week-ends. °You should follow a light diet the first few days after arrival home, such as soup and crackers, pudding, etc.unless your doctor has advised otherwise. A high-fiber, low fat diet can be resumed as tolerated.   Be sure to include lots of fluids daily. Most patients will experience some swelling and bruising on the chest and neck area.  Ice packs will help.  Swelling and bruising can take several days to resolve °Most patients will experience some swelling and bruising in the area of the incision. Ice pack will help. Swelling and bruising can take several days to resolve..  °It is common to experience some constipation if taking pain medication after surgery.  Increasing fluid intake and taking a stool softener will usually help or prevent this problem from occurring.  A mild laxative (Milk of Magnesia or Miralax) should be taken according to package directions if there are no bowel movements after 48 hours. ° You may have steri-strips (small skin tapes) in place directly over the incision.  These strips should be left on the skin for 7-10 days.  If your surgeon used skin  glue on the incision, you may shower in 24 hours.  The glue will flake off over the next 2-3 weeks.  Any sutures or staples will be removed at the office during your follow-up visit. You may find that a light gauze bandage over your incision may keep your staples from being rubbed or pulled. You may shower and replace the bandage daily. °ACTIVITIES:  You may resume regular (light) daily activities beginning the next day--such as daily self-care, walking, climbing stairs--gradually increasing activities as tolerated.  You may have sexual intercourse when it is comfortable.  Refrain from any heavy lifting or straining until approved by your doctor. °You may drive when you no longer are taking prescription pain medication, you can comfortably wear a seatbelt, and you can safely maneuver your car and apply brakes ° °You should see your doctor in the office for a follow-up appointment approximately two weeks after your surgery.  Make sure that you call for this appointment within a day or two after you arrive home to insure a convenient appointment time. ° °WHEN TO CALL YOUR DOCTOR: °Fever over 101.0 °Inability to urinate °Nausea and/or vomiting °Extreme swelling or bruising °Continued bleeding from incision. °Increased pain, redness, or drainage from the incision. °Difficulty swallowing or breathing °Muscle cramping or spasms. °Numbness or tingling in hands or feet or around lips. ° °The clinic staff is available to answer your questions during regular business hours.  Please don’t hesitate to call and ask to speak to one of the nurses if you have concerns. ° °For   further questions, please visit [email or web address]  °

## 2022-07-13 NOTE — Progress Notes (Signed)
   07/13/22 1100  Mobility  Activity Ambulated with assistance in hallway  Range of Motion/Exercises Active  Level of Assistance Contact guard assist, steadying assist  Assistive Device Front wheel walker;Other (Comment) (used walker for 130f, ambulated w/ out the rest of the way)  Distance Ambulated (ft) 250 ft  Activity Response Tolerated well  Transport method Ambulatory  $Mobility charge 1 Mobility   Pt was agreeable to ambulate. Pt ambulated 1571fw/ walker but then ambulated w/ out walker for 10063fPt mentioned feeling a little wobbly towards the end. Pt left in recliner w/ necessities in reach.   [facility]

## 2022-07-13 NOTE — Consult Note (Signed)
[facility] Nurse ostomy follow up Stoma type/location: LLQ, end colostomy Stomal assessment/size: a little less than 2" round, budded, pink, moist Peristomal assessment: intact  Treatment options for stomal/peristomal skin: 2" skin barrier ring  Output liquid brown stool  Ostomy pouching: 2pc. 2 3/4" with 2" skin barrier ring  Education provided:  Met with patient and her husband today; using Spanish interpreter service  Demonstrated pouch change again to husband he recalls some of the teaching from last week but request "observation" again(cutting new skin barrier, measuring stoma, cleaning peristomal skin and stoma, use of barrier ring) Re-Education on emptying when 1/3 to 1/2 full and how to empty Demonstrated "burping" flatus from pouch Demonstrated use of wick to clean spout  Discussed bathing, diet, gas, medication use, constipation Discussed in details with patient and her husband the customer assistance program [facility]) and they must call them, provided number and the item numbers for what she is currently using successfully.  They must call themselves to provide financial information in order to receive 3 months of free pouches Stressed the importance of cleaning the spout, when I arrived spout is seeping and not closed, potentially could have been major accident with leakage of pouch.  Staff on the floor re-educated as well about cleaning spout and closing the pouch effectively. Placed pouch in the downward direction to allow for patient participation in emptying pouch.  6 pouching systems ordered/barrier rings for DC to home.    Enrolled patient in [facility] Discharge program: Yes [place], [place], [place]

## 2022-07-13 NOTE — Plan of Care (Signed)
Discussed with patient in front of family plan of care for the evening, pain management and sleep medication with some teach back displayed  Problem: Education: Goal: Knowledge of General Education information will improve Description: Including pain rating scale, medication(s)/side effects and non-pharmacologic comfort measures Outcome: Progressing

## 2022-07-14 ENCOUNTER — Other Ambulatory Visit (HOSPITAL_COMMUNITY): Payer: Self-pay

## 2022-07-14 DIAGNOSIS — Z933 Colostomy status: Secondary | ICD-10-CM

## 2022-07-14 LAB — GLUCOSE, CAPILLARY: Glucose-Capillary: 109 mg/dL — ABNORMAL HIGH (ref 70–99)

## 2022-07-14 MED ORDER — METHOCARBAMOL 500 MG PO TABS
1000.0000 mg | ORAL_TABLET | Freq: Three times a day (TID) | ORAL | 0 refills | Status: DC | PRN
  Filled 2022-07-14: qty 60, 10d supply, fill #0

## 2022-07-14 MED ORDER — ORAL CARE MOUTH RINSE
15.0000 mL | OROMUCOSAL | Status: DC | PRN
Start: 2022-07-14 — End: 2022-07-14

## 2022-07-14 MED ORDER — LEVOTHYROXINE SODIUM 125 MCG PO TABS
125.0000 ug | ORAL_TABLET | Freq: Every day | ORAL | 3 refills | Status: DC
  Filled 2022-07-14: qty 30, 30d supply, fill #0

## 2022-07-14 MED ORDER — OXYCODONE HCL 5 MG PO TABS
5.0000 mg | ORAL_TABLET | Freq: Four times a day (QID) | ORAL | 0 refills | Status: DC | PRN
  Filled 2022-07-14: qty 20, 5d supply, fill #0

## 2022-07-14 MED ORDER — LIDOCAINE 5 % EX PTCH
1.0000 | MEDICATED_PATCH | CUTANEOUS | 0 refills | Status: DC
  Filled 2022-07-14: qty 30, 30d supply, fill #0

## 2022-07-14 MED ORDER — ACETAMINOPHEN 325 MG PO TABS: 650.0000 mg | ORAL_TABLET | Freq: Four times a day (QID) | ORAL | Status: DC | PRN

## 2022-07-14 MED ORDER — AMOXICILLIN-POT CLAVULANATE 875-125 MG PO TABS
1.0000 | ORAL_TABLET | Freq: Two times a day (BID) | ORAL | 0 refills | Status: AC
  Filled 2022-07-14: qty 6, 3d supply, fill #0

## 2022-07-14 NOTE — Progress Notes (Signed)
Physical Therapy Treatment Patient Details Name: Heather Vang MRN: [id number] DOB: [birth year] Today's Date: 07/14/2022   History of Present Illness Patient is a 71 year old female who was admitted with concerns for perforated diverticulitis with abscess. Patient underwent diagnostic lapariscopy converted to open hartmann's procedure. PMH: diabetes type 2, hypertension, acquired hypothyroidism.    PT Comments    Patient making good progress with mobility and ambulated ~400' with no UE support and use of IV pole intermittently to steady. EOS reviewed functional LE exercises for sit<>stands and heel raises. Will continue to progress during hospital stay. Anticipate no follow up needed when pt ready to discharge home.    Recommendations for follow up therapy are one component of a multi-disciplinary discharge planning process, led by the attending physician.  Recommendations may be updated based on patient status, additional functional criteria and insurance authorization.  Follow Up Recommendations  No PT follow up     Assistance Recommended at Discharge PRN  Patient can return home with the following Assistance with cooking/housework;Assist for transportation;A little help with bathing/dressing/bathroom;Help with stairs or ramp for entrance   Equipment Recommendations  None recommended by PT    Recommendations for Other Services       Precautions / Restrictions Precautions Precautions: None Precaution Comments: abdominal incisions, ostomy Restrictions Weight Bearing Restrictions: No     Mobility  Bed Mobility Overal bed mobility: Needs Assistance Bed Mobility: Supine to Sit     Supine to sit: Supervision     General bed mobility comments: HOB elevated, pt completed with supervision    Transfers Overall transfer level: Needs assistance Equipment used: Rolling walker (2 wheels), None Transfers: Sit to/from Stand Sit to Stand: Modified independent  (Device/Increase time)           General transfer comment: mod I from EOB and recliner    Ambulation/Gait Ambulation/Gait assistance: Supervision Gait Distance (Feet): 400 Feet Assistive device: Rolling walker (2 wheels) Gait Pattern/deviations: Step-through pattern Gait velocity: decr     General Gait Details: supervision for safety to ambualte with and without IV pole. no LOB, HR stable in 110's.   Stairs             Wheelchair Mobility    Modified Rankin (Stroke Patients Only)       Balance                                            Cognition Arousal/Alertness: Awake/alert Behavior During Therapy: WFL for tasks assessed/performed Overall Cognitive Status: Within Functional Limits for tasks assessed                                          Exercises Other Exercises Other Exercises: 1x10 reps sit<>stand no UE use Other Exercises: 1x15 reps heel raises    General Comments        Pertinent Vitals/Pain Pain Assessment Pain Assessment: Faces Faces Pain Scale: Hurts a little bit Pain Location: abdomen and low back Pain Descriptors / Indicators: Operative site guarding Pain Intervention(s): Limited activity within patient's tolerance, Monitored during session, Repositioned    Home Living                          Prior Function  PT Goals (current goals can now be found in the care plan section) Acute Rehab PT Goals Patient Stated Goal: to get a good night's sleep PT Goal Formulation: With patient/family Time For Goal Achievement: 07/22/22 Potential to Achieve Goals: Good Progress towards PT goals: Progressing toward goals    Frequency    Min 3X/week      PT Plan Current plan remains appropriate    Co-evaluation              AM-PAC PT "6 Clicks" Mobility   Outcome Measure  Help needed turning from your back to your side while in a flat bed without using bedrails?: A  Lot Help needed moving from lying on your back to sitting on the side of a flat bed without using bedrails?: A Lot Help needed moving to and from a bed to a chair (including a wheelchair)?: A Little Help needed standing up from a chair using your arms (e.g., wheelchair or bedside chair)?: A Little Help needed to walk in hospital room?: A Little Help needed climbing 3-5 steps with a railing? : A Little 6 Click Score: 16    End of Session   Activity Tolerance: Patient tolerated treatment well Patient left: in bed;with call bell/phone within reach;with family/visitor present Nurse Communication: Mobility status PT Visit Diagnosis: Difficulty in walking, not elsewhere classified (R26.2);Pain     Time: 1001-1018 PT Time Calculation (min) (ACUTE ONLY): 17 min  Charges:  $Therapeutic Exercise: 8-22 mins                     Verner Mould, DPT Acute Rehabilitation Services Office [phone] Pager [phone]  07/14/22 12:14 PM

## 2022-07-14 NOTE — Progress Notes (Signed)
Progress Note  7 Days Post-Op  Subjective: Spoke with patient with the assistance of the spanish interpreter video ipad service.  Doing well. Tolerating regular diet and having bowel function. Pain well controlled. Feels comfortable with ostomy care. Hoping to go home today.   Objective: Vital signs in last 24 hours: Temp:  [98.2 F (36.8 C)-98.6 F (37 C)] 98.2 F (36.8 C) (08/08 0551) Pulse Rate:  [77-81] 81 (08/08 0551) Resp:  [17-18] 17 (08/08 0551) BP: (103-118)/(44-73) 116/52 (08/08 0551) SpO2:  [94 %-95 %] 94 % (08/08 0551) Weight:  [70.7 kg] 70.7 kg (08/08 0500) Last BM Date : 07/13/22  Intake/Output from previous day: 08/07 0701 - 08/08 0700 In: 1110 [P.O.:960; IV Piggyback:150] Out: 200 [Stool:200] Intake/Output this shift: No intake/output data recorded.  PE: General: pleasant, WD, WN female who is sitting up in NAD Lungs: Respiratory effort nonlabored Abd: soft, appropriately ttp, mild distention, +BS, stoma viable with gas and stool in bag, drain removed from inferior midline incision and dry dressing applied  Psych: A&Ox3 with an appropriate affect.    Lab Results:  Recent Labs    07/13/22 0445  WBC 13.7*  HGB 9.4*  HCT 28.8*  PLT 652*    BMET Recent Labs    07/12/22 0626 07/13/22 0445  NA 139 139  K 3.4* 3.7  CL 110 109  CO2 22 22  GLUCOSE 127* 118*  BUN <5* <5*  CREATININE 0.51 0.43*  CALCIUM 8.0* 8.2*    PT/INR No results for input(s): "LABPROT", "INR" in the last 72 hours. CMP     Component Value Date/Time   NA 139 07/13/2022 0445   K 3.7 07/13/2022 0445   CL 109 07/13/2022 0445   CO2 22 07/13/2022 0445   GLUCOSE 118 (H) 07/13/2022 0445   BUN <5 (L) 07/13/2022 0445   CREATININE 0.43 (L) 07/13/2022 0445   CALCIUM 8.2 (L) 07/13/2022 0445   PROT 6.3 (L) 07/11/2022 0552   ALBUMIN 2.3 (L) 07/11/2022 0552   AST 13 (L) 07/11/2022 0552   ALT 16 07/11/2022 0552   ALKPHOS 48 07/11/2022 0552   BILITOT 0.5 07/11/2022 0552    GFRNONAA >60 07/13/2022 0445   Lipase     Component Value Date/Time   LIPASE 22 07/01/2022 1637       Studies/Results: No results found.  Anti-infectives: Anti-infectives (From admission, onward)    Start     Dose/Rate Route Frequency Ordered Stop   07/02/22 0645  piperacillin-tazobactam (ZOSYN) IVPB 3.375 g  Status:  Discontinued        3.375 g 12.5 mL/hr over 240 Minutes Intravenous Every 8 hours 07/02/22 0546 07/02/22 0550   07/02/22 0200  piperacillin-tazobactam (ZOSYN) IVPB 3.375 g        3.375 g 12.5 mL/hr over 240 Minutes Intravenous Every 8 hours 07/01/22 2115     07/01/22 2015  piperacillin-tazobactam (ZOSYN) IVPB 3.375 g        3.375 g 100 mL/hr over 30 Minutes Intravenous  Once 07/01/22 2001 07/01/22 2111        Assessment/Plan Perforated diverticulitis  POD 7 s/p diagnostic laparoscopic, converted to open Hartmann's Dr. Thermon Leyland - WOC following - referral made to ostomy clinic  - Regular diet - PT/OT - penrose drain removed at bedside this AM - surgical path with acute diverticulitis, no malignancy noted - stable for discharge from a surgical standpoint. Follow up in [facility]. Will DC home on 3 days of PO abx for total of 10 days of abx  from source control   FEN: Regular diet VTE: SQH ID: Zosyn 7/26> 8/8, PO augmentin x 3 days on DC  - below per TRH -  HTN T2DM hypothyroidism  LOS: 13 days     Norm Parcel, [facility] 07/14/2022, 9:15 AM Please see Amion for pager number during day hours 7:00am-4:30pm

## 2022-07-14 NOTE — Discharge Summary (Signed)
Physician Discharge Summary   Patient: Heather Vang MRN: [id number] DOB: November 29, 1951  Admit date:     07/01/2022  Discharge date: 07/14/22  Discharge Physician: Oswald Hillock   PCP: Pcp, No   Recommendations at discharge:   Follow-up general surgery on 08/05/2022 Referral made to ostomy clinic  as outpatient  Discharge Diagnoses: Principal Problem:   Perforated diverticulum Active Problems:   Diverticulitis of colon with perforation   Sepsis ([place])   Hypertension   DM2 (diabetes mellitus, type 2) ([place])   Acquired hypothyroidism  Resolved Problems:   * No resolved hospital problems. *  Hospital Course: 71 year old female with past medical history of diabetes mellitus type 2, hypertension, acquired hypothyroidism came to hospital with abdominal pain and was found to have perforated diverticulum.  CT abdomen/pelvis showed diverticulosis of the colon with pericolonic inflammatory changes in mid to distal descending colon consistent with acute diverticulitis.  Diffuse free air in the abdomen likely indicate perforated ventriculitis.  Repeat CT abdomen showed worsening inflammation and new abscess.  Patient underwent diagnostic laparoscopy which was converted to open Hartmann procedure per general surgery.    Assessment and Plan:  Descending colon perforation with abscess/perforated diverticulitis -Presented with abdominal pain, fever and leukocytosis -CT abdomen showed diverticulosis of the colon with pericolonic inflammatory changes in mid to distal descending colon consistent with acute diverticulitis, free air in the abdomen likely indicate perforated diverticulitis -Started on IV Zosyn and IV fluids;  -General surgery was consulted -Repeat CT scan showed worsening inflammatory changes in the bowel and new abscess with concern for small bowel perforation -Underwent diagnostic laparoscopy which was converted to open Hartmann procedure on 07/07/2022 -Patient does not tolerating  diet.  Ostomy functioning fine. -General surgery will follow-up as outpatient on 08/05/2022, referral made to ostomy clinic -General surgery recommends 3 more days of p.o. Augmentin to complete 10 days of treatment -Opioids, oxycodone as needed has been ordered.  Sepsis due to perforated diverticulum -Patient was started on IV antibiotics as above -Sepsis physiology resolved   Diabetes mellitus type 2 -Hemoglobin A1c 6.3 -Patient not on medications at home   Left upper extremity swelling -Patient has mild upper extremity swelling secondary to IV placement -Likely mild thrombophlebitis -Left upper extremity venous duplex was ordered which was negative for DVT   Hypertension -Norvasc on hold due to soft blood pressure -Will discontinue Norvasc at this time   Hypothyroidism -Continue Synthroid at 125 mcg daily   Hypokalemia -Replete         Consultants: General surgery Procedures performed: Diagnostic laparoscopy converted to A [place] procedure Disposition: Home Diet recommendation:  Discharge Diet Orders (From admission, onward)     Start     Ordered   07/14/22 0000  Diet - low sodium heart healthy        07/14/22 1041           Carb modified diet DISCHARGE MEDICATION: Allergies as of 07/14/2022   No Known Allergies      Medication List     STOP taking these medications    amLODipine 5 MG tablet Commonly known as: NORVASC   naproxen sodium 550 MG tablet Commonly known as: Anaprox DS       TAKE these medications    acetaminophen 325 MG tablet Commonly known as: TYLENOL Take 2 tablets (650 mg total) by mouth every 6 (six) hours as needed for mild pain or fever.   amoxicillin-clavulanate 875-125 MG tablet Commonly known as: AUGMENTIN Take 1 tablet by mouth 2 (  two) times daily for 3 days.   estazolam 2 MG tablet Commonly known as: PROSOM Take 2 mg by mouth at bedtime.   levothyroxine 125 MCG tablet Commonly known as: SYNTHROID Take 1 tablet  (125 mcg total) by mouth daily before breakfast.   lidocaine 5 % Commonly known as: LIDODERM Place 1 patch onto the skin daily. Remove & discard patch within 12 hours or as directed by MD   metFORMIN 1000 MG tablet Commonly known as: GLUCOPHAGE Take 1,000 mg by mouth daily.   methocarbamol 500 MG tablet Commonly known as: ROBAXIN Take 2 tablets (1,000 mg total) by mouth every 8 (eight) hours as needed for muscle spasms.   oxyCODONE 5 MG immediate release tablet Commonly known as: Oxy IR/ROXICODONE Take 1 tablet (5 mg total) by mouth every 6 (six) hours as needed for moderate or severe pain.        Follow-up Information     Stechschulte, Nickola Major, MD. Go on 08/05/2022.   Specialty: Surgery Why: 3:20 PM. Please arrive 30 min prior to appointment time. Please have ID and any insurance information with you. Contact information: [place]. [address] [place] [address] [phone]                Discharge Exam: Danley Danker Weights   07/12/22 0547 07/13/22 0539 07/14/22 0500  Weight: 71.2 kg 73.6 kg 70.7 kg   General-appears in no acute distress Heart-S1-S2, regular, no murmur auscultated Lungs-clear to auscultation bilaterally, no wheezing or crackles auscultated Abdomen-soft, nontender, no organomegaly Extremities-no edema in the lower extremities Neuro-alert, oriented x3, no focal deficit noted  Condition at discharge: good  The results of significant diagnostics from this hospitalization (including imaging, microbiology, ancillary and laboratory) are listed below for reference.   Imaging Studies: VAS [place] UPPER EXTREMITY VENOUS DUPLEX  Result Date: 07/11/2022 UPPER VENOUS STUDY  Patient Name:  SHARONLEE NINE [facility]  Date of Exam:   07/11/2022 Medical Rec #: [id number]                Accession #:    [id number] Date of Birth: 1951-08-31                 Patient Gender: F Patient Age:   42 years Exam Location:  [facility] Procedure:      VAS [place] UPPER EXTREMITY  VENOUS DUPLEX Referring Phys: Frederich Chick Debraann Livingstone --------------------------------------------------------------------------------  Indications: Swelling Risk Factors: Recent IV placement. Performing Technologist: Rogelia Rohrer RVT, RDMS  Examination Guidelines: A complete evaluation includes B-mode imaging, spectral Doppler, color Doppler, and power Doppler as needed of all accessible portions of each vessel. Bilateral testing is considered an integral part of a complete examination. Limited examinations for reoccurring indications may be performed as noted.  Right Findings: +----------+------------+---------+-----------+----------+-------+ RIGHT     CompressiblePhasicitySpontaneousPropertiesSummary +----------+------------+---------+-----------+----------+-------+ Subclavian    Full       Yes       Yes                      +----------+------------+---------+-----------+----------+-------+  Left Findings: +----------+------------+---------+-----------+----------+-------+ LEFT      CompressiblePhasicitySpontaneousPropertiesSummary +----------+------------+---------+-----------+----------+-------+ IJV           Full       Yes       Yes                      +----------+------------+---------+-----------+----------+-------+ Subclavian    Full       Yes  Yes                      +----------+------------+---------+-----------+----------+-------+ Axillary      Full       Yes       Yes                      +----------+------------+---------+-----------+----------+-------+ Brachial      Full       Yes       Yes                      +----------+------------+---------+-----------+----------+-------+ Radial        Full                                          +----------+------------+---------+-----------+----------+-------+ Ulnar         Full                                          +----------+------------+---------+-----------+----------+-------+ Cephalic      Full                                           +----------+------------+---------+-----------+----------+-------+ Basilic       Full       Yes       Yes                      +----------+------------+---------+-----------+----------+-------+ Only one of paired brachial veins visualized on this exam.  Summary:  Right: No evidence of thrombosis in the subclavian.  Left: No evidence of deep vein thrombosis in the upper extremity. No evidence of superficial vein thrombosis in the upper extremity.  *See table(s) above for measurements and observations.  Diagnosing physician: Deitra Mayo MD Electronically signed by Deitra Mayo MD on 07/11/2022 at 7:03:25 PM.    Final    CT ABDOMEN PELVIS W CONTRAST  Result Date: 07/06/2022 CLINICAL DATA:  Left lower quadrant abdominal pain. Known distal descending colon and proximal sigmoid colon diverticulitis and known free air on prior CT. EXAM: CT ABDOMEN AND PELVIS WITH CONTRAST TECHNIQUE: Multidetector CT imaging of the abdomen and pelvis was performed using the standard protocol following bolus administration of intravenous contrast. RADIATION DOSE REDUCTION: This exam was performed according to the departmental dose-optimization program which includes automated exposure control, adjustment of the mA and/or kV according to patient size and/or use of iterative reconstruction technique. CONTRAST:  132m OMNIPAQUE IOHEXOL 300 MG/ML  SOLN COMPARISON:  CT abdomen and pelvis 07/01/2022 FINDINGS: Lower chest: There is curvilinear posterior right lower lobe subsegmental atelectasis versus scarring that appears similar to prior. Posterolateral left lower lobe linear opacity and heterogeneous opacities with mild consolidation is a slightly different configuration compared to recent 07/01/2022 CT otherwise similar in size and amount. This may represent atelectasis but pneumonia is difficult to completely exclude. No pleural effusion. Hepatobiliary: Smooth liver contours.  No focal liver mass is identified. Mildly decreased density within the liver suggests fatty infiltration. 1 unchanged tiny low-density lesion within the central aspect of the liver adjacent to the IVC (axial series 2, image 15), too small to further characterize. Two  gallstones are seen near the gallbladder neck, measuring up to 8 mm. No intrahepatic or extrahepatic biliary ductal dilatation. Pancreas: No mass or inflammatory fat stranding. No pancreatic ductal dilatation is seen. Spleen: Normal in size without focal abnormality. Adrenals/Urinary Tract: Adrenal glands are unremarkable. The kidneys enhance uniformly and are symmetric in size without hydronephrosis. No renal stone is seen. No renal mass is seen. No focal urinary bladder wall thickening. Stomach/Bowel: There is now interval increase in the involvement of the distal colon with wall thickening and peripheral inflammatory fat stranding. This now involves the distal transverse colon, entire descending colon and the sigmoid colon. The transverse colon through the rectum is generally decompressed which contributes to the appearance of the bowel wall thickening. There are inflammatory changes greatest within the mid to distal descending colon again suggesting diverticulitis as seen on the prior 07/01/2022 CT. The terminal ileum is unremarkable. Normal appendix (axial series 2 images 57 through 61). Delete that delete that small sliding hiatal hernia is unchanged. There is a rim enhancing air and fluid collection within the left hemiabdomen measuring up to approximately 9.6 x 5.0 x 8.6 cm (axial series 2, images 35 through 51 and coronal images 51 through 67. This air tapers towards a loop of worsened moderate wall thickening and surrounding inflammatory fat stranding within the left hemiabdomen (coronal images 48 through 55), and it is difficult to exclude perforation of the small bowel in this region versus this free air interposed around the loops of small  bowel. I favor there to be a perforation of the inflamed left hemiabdomen small bowel in this region. There are dilated loops of small bowel within the proximal small bowel of the upper abdomen measuring up to 4.2 cm with internal air-fluid levels. This appears to be small obstruction related to the worsening inflammatory fat stranding of the left hemiabdomen small bowel where there is also concern for perforation. Vascular/Lymphatic: No abdominal aortic aneurysm. Moderate atherosclerotic calcifications. No mesenteric, retroperitoneal, or pelvic lymphadenopathy. Reproductive: The uterus is present.  No gross adnexal abnormality. Other: There is moderate free air which is likely slightly worsened compared to 07/01/2022 prior CT.New mild free fluid within the pelvis. Musculoskeletal: Mild multilevel degenerative disc changes of the lower thoracic spine. There are numerous rim calcifications within the bilateral buttocks, likely injection granulomas. IMPRESSION: Compared to 07/01/2022: 1. There is worsening of the inflammatory changes related to distal colonic diverticulitis, now involving the distal transverse colon through the midsigmoid colon. Previously this was questioned as the source for free air seen on 07/01/2022 CT. It is still unclear whether there is a perforation of the distal colonic diverticulitis, however that cannot be excluded. 2. There is an abscess within the left hemiabdomen measuring up to approximately 9.6 x 5.0 x 8.6 cm (transverse by AP by craniocaudal) with internal air-fluid level which closely apposes loops of worsened inflamed left hemiabdomen proximal to mid small bowel. Findings are highly concerning for a perforation of this portion of the small bowel causing pneumoperitoneum and the adjacent abscess. 3. There is new dilatation of the small bowel upstream from the inflamed left hemiabdomen small bowel concerning for small bowel obstruction due to blockage at the highly inflamed and  likely perforated left hemiabdomen small bowel. 4. New mild ascites. Critical Value/emergent results were called by telephone at the time of interpretation on 07/06/2022 at 12:34 pm to provider DR. REGALADO, who verbally acknowledged these results. Electronically Signed   By: Yvonne Kendall M.D.   On: 07/06/2022 12:36  CT Abdomen Pelvis W Contrast  Result Date: 07/01/2022 CLINICAL DATA:  Bowel obstruction suspected. Abdominal pain, nausea, and vomiting since Monday. EXAM: CT ABDOMEN AND PELVIS WITH CONTRAST TECHNIQUE: Multidetector CT imaging of the abdomen and pelvis was performed using the standard protocol following bolus administration of intravenous contrast. RADIATION DOSE REDUCTION: This exam was performed according to the departmental dose-optimization program which includes automated exposure control, adjustment of the mA and/or kV according to patient size and/or use of iterative reconstruction technique. CONTRAST:  159m OMNIPAQUE IOHEXOL 300 MG/ML  SOLN COMPARISON:  None Available. FINDINGS: Lower chest: Streaky infiltrates in the lung bases, likely linear atelectasis. Small esophageal hiatal hernia. Hepatobiliary: No focal liver lesions. Vague densities in the gallbladder likely representing small non radiopaque stones. No gallbladder wall thickening. No bile duct dilatation. Pancreas: Unremarkable. No pancreatic ductal dilatation or surrounding inflammatory changes. Spleen: Normal in size without focal abnormality. Adrenals/Urinary Tract: Adrenal glands are unremarkable. Kidneys are normal, without renal calculi, focal lesion, or hydronephrosis. Bladder is unremarkable. Stomach/Bowel: Stomach, small bowel, and colon are not abnormally distended. Fluid-filled nondistended small bowel are present. Diverticulosis of the sigmoid colon with stranding around the mid/distal descending colon. Changes are consistent with acute diverticulitis. Mild wall thickening of adjacent small bowel suggesting reactive  inflammation or possibly enteritis. Appendix is normal. Vascular/Lymphatic: Aortic atherosclerosis. No enlarged abdominal or pelvic lymph nodes. Reproductive: Uterus and bilateral adnexa are unremarkable. Other: Diffuse free abdominal air is demonstrated with air extending into the abdominal hiatus. In the absence of recent surgery, this likely represents bowel perforation. Given additional finding of apparent acute diverticulitis, this likely represents a perforated diverticulitis. No free fluid in the abdomen. Abdominal wall musculature appears intact. Musculoskeletal: No acute or significant osseous findings. IMPRESSION: 1. Diverticulosis of the colon with pericolonic inflammatory changes in the mid/distal descending colon consistent with acute diverticulitis. 2. Diffuse free air in the abdomen likely indicates perforated diverticulitis. 3. Small bowel wall thickening in the left abdomen likely represents reactive inflammation although enteritis could also have this appearance. 4. Cholelithiasis without evidence of acute cholecystitis. 5. Linear atelectasis in the lung bases. Critical Value/emergent results were called by telephone at the time of interpretation on 07/01/2022 at 7:55 pm to provider Dr. FTyrone Nine who verbally acknowledged these results. Electronically Signed   By: WLucienne CapersM.D.   On: 07/01/2022 20:02    Microbiology: No results found for this or any previous visit.  Labs: CBC: Recent Labs  Lab 07/08/22 0505 07/09/22 0538 07/10/22 0448 07/11/22 0552 07/13/22 0445  WBC 25.2* 26.6* 20.4* 17.9* 13.7*  HGB 11.6* 10.8* 9.5* 9.9* 9.4*  HCT 34.1* 31.9* 29.2* 30.3* 28.8*  MCV 90.7 91.1 93.0 93.2 93.2  PLT 413* 472* 467* 571* 6993   Basic Metabolic Panel: Recent Labs  Lab 07/09/22 0538 07/10/22 0448 07/11/22 0552 07/12/22 0626 07/13/22 0445  NA 138 138 135 139 139  K 3.6 3.7 3.4* 3.4* 3.7  CL 107 107 103 110 109  CO2 '24 24 22 22 22  '$ GLUCOSE 169* 122* 113* 127* 118*  BUN  6* <5* <5* <5* <5*  CREATININE 0.40* 0.40* 0.31* 0.51 0.43*  CALCIUM 8.0* 8.1* 7.9* 8.0* 8.2*  MG  --  1.8  --   --   --    Liver Function Tests: Recent Labs  Lab 07/09/22 0538 07/11/22 0552  AST 15 13*  ALT 19 16  ALKPHOS 46 48  BILITOT 0.5 0.5  PROT 6.1* 6.3*  ALBUMIN 2.3* 2.3*   CBG: Recent Labs  Lab 07/13/22 0834 07/13/22 1141 07/13/22 1722 07/13/22 2151 07/14/22 0755  GLUCAP 113* 123* 112* 121* 109*    Discharge time spent: greater than 30 minutes.  Signed: Oswald Hillock, MD [facility] 07/14/2022

## 2022-07-27 ENCOUNTER — Other Ambulatory Visit (HOSPITAL_COMMUNITY): Payer: Self-pay

## 2022-07-31 ENCOUNTER — Encounter (HOSPITAL_COMMUNITY): Payer: Self-pay | Admitting: Emergency Medicine

## 2022-07-31 ENCOUNTER — Ambulatory Visit (HOSPITAL_COMMUNITY)
Admission: EM | Admit: 2022-07-31 | Discharge: 2022-07-31 | Disposition: A | Discharge status: Home / Self Care | Payer: Self-pay | Attending: Emergency Medicine | Admitting: Emergency Medicine

## 2022-07-31 DIAGNOSIS — R109 Unspecified abdominal pain: Secondary | ICD-10-CM

## 2022-07-31 DIAGNOSIS — T8140XA Infection following a procedure, unspecified, initial encounter: Secondary | ICD-10-CM

## 2022-07-31 MED ORDER — METRONIDAZOLE 500 MG PO TABS: 500.0000 mg | ORAL_TABLET | Freq: Two times a day (BID) | ORAL | 0 refills | Status: AC

## 2022-07-31 MED ORDER — CIPROFLOXACIN HCL 500 MG PO TABS: 500.0000 mg | ORAL_TABLET | Freq: Two times a day (BID) | ORAL | 0 refills | Status: AC

## 2022-07-31 NOTE — ED Provider Notes (Signed)
[facility]    CSN: [id number] Arrival date & time: 07/31/22  1438      History   Chief Complaint Chief Complaint  Patient presents with   Post-op Problem    HPI Heather Vang is a 71 y.o. female.  Presents with postop problem. Patient was admitted to the hospital for perforated bowel with repair from 7/26-8/8.  She was put on Augmentin for 10 days.  Reports incision site now has drainage and foul odor. Dressing was changed by home health aide this morning. Some pain with movement and when aid was cleaning the area.   Denies fever, nausea/vomiting. Has ostomy bag in place, no diarrhea.   Tried to call her surgeon but was unable to get a hold of them.  Follow-up visit is not until 8/30  Past Medical History:  Diagnosis Date   Anxiety    Diabetes mellitus without complication ([place])    Hypertension    Thyroid disease     Patient Active Problem List   Diagnosis Date Noted   Sepsis ([place]) 07/02/2022   Hypertension    DM2 (diabetes mellitus, type 2) ([place])    Acquired hypothyroidism    Perforated diverticulum 07/01/2022   Diverticulitis of colon with perforation 07/01/2022   Left ankle sprain 06/03/2015    Past Surgical History:  Procedure Laterality Date   COSMETIC SURGERY     stomach    GALLBLADDER SURGERY  03/07/2014   LAPAROSCOPIC SMALL BOWEL RESECTION N/A 07/07/2022   Procedure: DIAGNOSTIC LAPAROSCOPY CONVERTED TO OPEN HARTMANN'S PROCEDURE;  Surgeon: Felicie Morn, MD;  Location: [facility];  Service: General;  Laterality: N/A;   SHOULDER SURGERY  06/06/2014    OB History   No obstetric history on file.      Home Medications    Prior to Admission medications   Medication Sig Start Date End Date Taking? Authorizing Provider  ciprofloxacin (CIPRO) 500 MG tablet Take 1 tablet (500 mg total) by mouth 2 (two) times daily for 5 days. 07/31/22 08/05/22 Yes Faelyn Sigler, Wells Guiles, PA-C  metroNIDAZOLE (FLAGYL) 500 MG tablet Take 1 tablet (500 mg total)  by mouth 2 (two) times daily for 5 days. 07/31/22 08/05/22 Yes Lacy Taglieri, Wells Guiles, PA-C  acetaminophen (TYLENOL) 325 MG tablet Take 2 tablets (650 mg total) by mouth every 6 (six) hours as needed for mild pain or fever. 07/14/22   Norm Parcel, PA-C  estazolam (PROSOM) 2 MG tablet Take 2 mg by mouth at bedtime.    [provider]  levothyroxine (SYNTHROID) 125 MCG tablet Take 1 tablet (125 mcg total) by mouth daily before breakfast. 07/14/22   Oswald Hillock, MD  lidocaine (LIDODERM) 5 % Place 1 patch onto the skin daily. Remove & discard patch within 12 hours or as directed by MD 07/14/22   Norm Parcel, PA-C  metFORMIN (GLUCOPHAGE) 1000 MG tablet Take 1,000 mg by mouth daily.    [provider]  methocarbamol (ROBAXIN) 500 MG tablet Take 2 tablets (1,000 mg total) by mouth every 8 (eight) hours as needed for muscle spasms. 07/14/22   Norm Parcel, PA-C  oxyCODONE (OXY IR/ROXICODONE) 5 MG immediate release tablet Take 1 tablet (5 mg total) by mouth every 6 (six) hours as needed for moderate or severe pain. 07/14/22   Norm Parcel, PA-C    Family History Family History  Problem Relation Age of Onset   Diabetes Mother     Social History Social History   Tobacco Use   Smoking  status: Never  Substance Use Topics   Alcohol use: Never   Drug use: Never     Allergies   Patient has no known allergies.   Review of Systems Review of Systems Per HPI  Physical Exam Triage Vital Signs ED Triage Vitals  Enc Vitals Group     BP 07/31/22 1459 (!) 131/47     Pulse Rate 07/31/22 1459 83     Resp 07/31/22 1459 16     Temp 07/31/22 1459 99.4 F (37.4 C)     Temp Source 07/31/22 1459 Oral     SpO2 07/31/22 1459 94 %     Weight --      Height --      Head Circumference --      Peak Flow --      Pain Score 07/31/22 1457 0     Pain Loc --      Pain Edu? --      Excl. in [place]? --    No data found.  Updated Vital Signs BP (!) 131/47 (BP Location: Left Arm)   Pulse 83    Temp 99.4 F (37.4 C) (Oral)   Resp 16   SpO2 94%    Physical Exam Vitals and nursing note reviewed.  Constitutional:      Appearance: Normal appearance.  HENT:     Mouth/Throat:     Mouth: Mucous membranes are moist.     Pharynx: Oropharynx is clear.  Eyes:     Conjunctiva/sclera: Conjunctivae normal.  Cardiovascular:     Rate and Rhythm: Normal rate and regular rhythm.     Heart sounds: Normal heart sounds.  Pulmonary:     Effort: Pulmonary effort is normal. No respiratory distress.     Breath sounds: Normal breath sounds.  Abdominal:     General: A surgical scar is present. The ostomy site is clean. Bowel sounds are normal.     Palpations: Abdomen is soft.     Tenderness: There is abdominal tenderness.       Comments: Purulent drainage from the top incision by the bellybutton.  Tenderness with palpation and gentle cleansing.  No area of surrounding erythema or warmth.  Smaller incision inferiorly with small amount of purulent drainage  Musculoskeletal:        General: Normal range of motion.  Skin:    General: Skin is warm and dry.  Neurological:     Mental Status: She is alert and oriented to person, place, and time.      UC Treatments / Results  Labs (all labs ordered are listed, but only abnormal results are displayed) Labs Reviewed - No data to display  EKG   Radiology No results found.  Procedures Procedures   Medications Ordered in UC Medications - No data to display  Initial Impression / Assessment and Plan / UC Course  I have reviewed the triage vital signs and the nursing notes.  Pertinent labs & imaging results that were available during my care of the patient were reviewed by me and considered in my medical decision making (see chart for details).  With increased drainage, foul odor, abdominal tenderness, will cover for intra-abdominal infection with Cipro and Flagyl.  Twice a day for 5 days.  She does have her surgeon follow-up  appointment on 8/30 but I recommend going to the emergency department with any signs of worsening symptoms.  Patient agrees to plan  Final Clinical Impressions(s) / UC Diagnoses   Final diagnoses:  Postoperative infection,  unspecified type, initial encounter  Abdominal pain, unspecified abdominal location     Discharge Instructions      I have sent two antibiotics to your pharmacy. Please take them both twice daily for 5 days. Follow up with your surgeon at your visit on 8/30.  With any worsening symptoms including increased pain, drainage, fever, vomiting, please go immediately to the emergency department.      ED Prescriptions     Medication Sig Dispense Auth. Provider   ciprofloxacin (CIPRO) 500 MG tablet Take 1 tablet (500 mg total) by mouth 2 (two) times daily for 5 days. 10 tablet Fields Oros, PA-C   metroNIDAZOLE (FLAGYL) 500 MG tablet Take 1 tablet (500 mg total) by mouth 2 (two) times daily for 5 days. 10 tablet Kristalyn Bergstresser, Wells Guiles, PA-C      PDMP not reviewed this encounter.   Juwaun Inskeep, Wells Guiles, [place] 07/31/22 1624

## 2022-07-31 NOTE — Discharge Instructions (Addendum)
I have sent two antibiotics to your pharmacy. Please take them both twice daily for 5 days. Follow up with your surgeon at your visit on 8/30.  With any worsening symptoms including increased pain, drainage, fever, vomiting, please go immediately to the emergency department.

## 2022-07-31 NOTE — ED Triage Notes (Signed)
Pt had admitted at hospital from 7/26-8/8. Had surgery for perforated bowel. Patient's incision site has pus drainage and now foul odor coming from it. Home health nurse changed dressing this morning and advised to be seen for infection. Unable to get a hold of surgeon and doesn't go to be seen until 8/30.

## 2022-08-11 ENCOUNTER — Inpatient Hospital Stay: Payer: Self-pay | Admitting: Internal Medicine

## 2022-10-23 ENCOUNTER — Ambulatory Visit (HOSPITAL_COMMUNITY)
Admission: RE | Admit: 2022-10-23 | Discharge: 2022-10-23 | Disposition: A | Discharge status: Home / Self Care | Payer: Self-pay | Source: Ambulatory Visit | Attending: Nurse Practitioner | Admitting: Nurse Practitioner

## 2022-10-23 DIAGNOSIS — Z439 Encounter for attention to unspecified artificial opening: Secondary | ICD-10-CM | POA: Insufficient documentation

## 2022-10-23 DIAGNOSIS — L24B3 Irritant contact dermatitis related to fecal or urinary stoma or fistula: Secondary | ICD-10-CM

## 2022-10-23 DIAGNOSIS — K9400 Colostomy complication, unspecified: Secondary | ICD-10-CM

## 2022-10-23 NOTE — Discharge Instructions (Signed)
Please call patient assistance with [employer] for pouches [phone]  (must dial the "1" from a cell phone)  Your supplies are 2 1/4" 2 piece pouch barrier ring  stoma powder and skin prep

## 2022-10-23 NOTE — Progress Notes (Signed)
[facility]   Reason for visit:  LLQ colostomy  Patient is spanish speaking and I have interpreter services in the room and the patient's adult family member is present and assisting per the patient preference. They indicate that she has no idea how to obtain supplies and have questions related to pouching. We will perform a pouch change today.  HPI:  Perforated bowel repair with LLQ colostomy ROS  Review of Systems  Gastrointestinal:        LLQ colostomy with peristomal breakdown  Skin:  Positive for rash.       Irritant contact dermatitis to peristomal skin   All other systems reviewed and are negative.  Vital signs:  BP (!) 142/82   Pulse 77   Temp 98.5 F (36.9 C) (Oral)   Resp 18   Ht 5' (1.524 m)   Wt 63.5 kg   SpO2 96%   BMI 27.34 kg/m  Exam:  Physical Exam Vitals reviewed.  Constitutional:      Appearance: Normal appearance.  Abdominal:     Palpations: Abdomen is soft.     Comments: LLQ colostomy, pink and moist  Skin:    Findings: Rash present.  Neurological:     Mental Status: She is alert and oriented to person, place, and time.  Psychiatric:        Mood and Affect: Mood normal.        Behavior: Behavior normal.     Stoma type/location:  LLQ colostomy Stomal assessment/size:  1 3/4" pink moist stoma  Peristomal assessment:  skin is red but intact Treatment options for stomal/peristomal skin: stoma powder and skin prep to red irritated skin.  barrier ring and 2 piece 2 1/4" pouch Output: soft brown stool Ostomy pouching: 2pc. 2 1/4" pouch with barrier ring  Education provided:  It is clearly documented that patient was given [employer] patient assistance program information via spanish interpreter. She was educated that she must call herself and we cannot complete this step for her. I reinforce this again today and her family member is given the number again and agrees to help her.  I provide the patient with 4 additional pouch sets as she has  none.      Impression/dx  colostomy Discussion  See above.  Must complete financial questions with [employer] patient assistance to obtain pouches free of charge.   Patient does not have follow up appointment with surgeon.  She was also told she needs to have a colonoscopy prior to reversal and this is not scheduled.  I call the office ([facility]) and speak with triage nurse who indicates that someone will call her.  I relay this information to patient and family.   Plan  See back in 2 weeks. She is feeling more confident in self care and in obtaining needed supplies.     Visit time: 50 minutes.   Maple Hudson FNP-BC

## 2022-11-09 ENCOUNTER — Encounter: Payer: Self-pay | Admitting: Physician Assistant

## 2022-11-12 ENCOUNTER — Encounter (HOSPITAL_COMMUNITY): Payer: Self-pay | Admitting: Emergency Medicine

## 2022-11-12 ENCOUNTER — Ambulatory Visit (HOSPITAL_COMMUNITY)
Admission: EM | Admit: 2022-11-12 | Discharge: 2022-11-12 | Disposition: A | Discharge status: Home / Self Care | Payer: Self-pay | Attending: Internal Medicine | Admitting: Internal Medicine

## 2022-11-12 ENCOUNTER — Other Ambulatory Visit: Payer: Self-pay

## 2022-11-12 DIAGNOSIS — Z5189 Encounter for other specified aftercare: Secondary | ICD-10-CM

## 2022-11-12 DIAGNOSIS — K9400 Colostomy complication, unspecified: Secondary | ICD-10-CM

## 2022-11-12 NOTE — ED Triage Notes (Addendum)
Reports skin irritation around edge of colostomy and bag.  Noticed irritation one week.     Patient reports a change in product selection  and design approximately 15 days ago

## 2022-11-12 NOTE — Discharge Instructions (Signed)
He programado una cita para que lo [place] en la clnica de colostoma [place] maana por la maana a las 9:30 a. m. Jolyn Lent ubicados en el segundo [place]. Vaya a la entrada principal y pregunte por la Clnica de Colostoma. Ellos te mostrarn cmo llegar. Si su bolso comienza a gotear antes de la cita, cmbielo. [facility] para el cuidado de ostomas [place], [place], [place] a atencin de [place] segn sea necesario.   I have schedule an appointment for you to be seen at [facility] tomorrow morning at 9:30am. They are located on the second floor of [facility] in the main entrance and ask for the [facility]. They will show you how to get there.   If your bag starts to leak before the appointment, please change it.   [facility] for [facility] [address] [place], [place] [address]  Return to urgent care as needed.

## 2022-11-13 ENCOUNTER — Ambulatory Visit (HOSPITAL_COMMUNITY)
Admit: 2022-11-13 | Discharge: 2022-11-13 | Disposition: A | Discharge status: Home / Self Care | Payer: Self-pay | Attending: Internal Medicine | Admitting: Internal Medicine

## 2022-11-13 DIAGNOSIS — L24B3 Irritant contact dermatitis related to fecal or urinary stoma or fistula: Secondary | ICD-10-CM

## 2022-11-13 DIAGNOSIS — L259 Unspecified contact dermatitis, unspecified cause: Secondary | ICD-10-CM | POA: Insufficient documentation

## 2022-11-13 DIAGNOSIS — Z433 Encounter for attention to colostomy: Secondary | ICD-10-CM | POA: Insufficient documentation

## 2022-11-13 NOTE — Discharge Instructions (Signed)
Continue 2 piece pouch and barrier ring Call [employer] [phone] (must dial the one) to enroll for patient assistance and free pouches.  Colonoscopy first Surgeon will follow up for appointment after.

## 2022-11-13 NOTE — Progress Notes (Signed)
[facility]   Reason for visit:  LLQ colostomy, seen at Urgent care yesterday for peristomal bleeding and discomfort  She did not have powder and her pouch was not changed. Instead, she was referred here today.  Interpreter services utilized for entire visit today.  She reports she has received a bag of supplies from [employer].  She has not called to enroll in [employer]. I will provide her with the number once again and remind her that she has to call to enroll.  She verbalizes understanding via interpreter.  HPI:  Perforated diverticulum with end colostomy Past Medical History:  Diagnosis Date   Anxiety    Diabetes mellitus without complication ([place])    Hypertension    Thyroid disease    Family History  Problem Relation Age of Onset   Diabetes Mother    No Known Allergies Current Outpatient Medications  Medication Sig Dispense Refill Last Dose   acetaminophen (TYLENOL) 325 MG tablet Take 2 tablets (650 mg total) by mouth every 6 (six) hours as needed for mild pain or fever.      estazolam (PROSOM) 2 MG tablet Take 2 mg by mouth at bedtime. (Patient not taking: Reported on 11/12/2022)      levothyroxine (SYNTHROID) 125 MCG tablet Take 1 tablet (125 mcg total) by mouth daily before breakfast. 30 tablet 3    lidocaine (LIDODERM) 5 % Place 1 patch onto the skin daily. Remove & discard patch within 12 hours or as directed by MD 30 patch 0    metFORMIN (GLUCOPHAGE) 1000 MG tablet Take 1,000 mg by mouth daily.      methocarbamol (ROBAXIN) 500 MG tablet Take 2 tablets (1,000 mg total) by mouth every 8 (eight) hours as needed for muscle spasms. (Patient not taking: Reported on 11/12/2022) 60 tablet 0    oxyCODONE (OXY IR/ROXICODONE) 5 MG immediate release tablet Take 1 tablet (5 mg total) by mouth every 6 (six) hours as needed for moderate or severe pain. (Patient not taking: Reported on 11/12/2022) 20 tablet 0    No current facility-administered medications for this encounter.    ROS  Review of Systems  Gastrointestinal:        LLQ colostomy  Skin:  Positive for rash.       Minimal dermatitis to peristomal skin at 6 o'clock  Psychiatric/Behavioral: Negative.    All other systems reviewed and are negative.  Vital signs:  There were no vitals taken for this visit. Exam:  Physical Exam Constitutional:      Appearance: Normal appearance.  Abdominal:     Palpations: Abdomen is soft.  Skin:    Findings: Rash present.     Comments: Peristomal breakdown, partial thickness  Neurological:     Mental Status: She is alert and oriented to person, place, and time.  Psychiatric:        Mood and Affect: Mood normal.        Behavior: Behavior normal.     Stoma type/location:  LLQ colostomy Stomal assessment/size:  1 1/2"   Peristomal assessment:  skin is surprisingly intact except for a 0.3 cm partial thickness dermatitis at 6 o'clock  She uses powder and skin prep. She was concerned for blood tinged drainage when she wipes her stoma, I explain this is normal.  She also feared she had infection due to the presence of mucus from the stoma. I explain this is also normal and why. She is relieved.  Treatment options for stomal/peristomal skin: continue stoma  powder and skin prep to denuded skin .Apply barrier ring and 2 piece pouch  She wears a pouch cover as well. I assure her she is doing a good job and her skin looks healthy.. She is changing every 5-6 day and I encourage her to change every 4 days for now as I think stool is seeping under pouch after so many days.  Output: soft brown stool Ostomy pouching: 2pc. 2 3/4" with barrier ring   Education provided:  See above  Entire visit performed with assistance of interpreter services.     Impression/dx  Contact dermatitis colostomy Discussion  We discuss supply acquisition with [employer].  She has upcoming colonoscopy and is unsure about how to proceed with reversal after that. I inform her that results will go to  surgery team and someone will reach out to schedule appointment to discuss reversal. She has a bill from a lab that has Pathology in the memo.  I explain that is not from the clinic and not from a [facility] affiliated business.  She has paid the bill but was unsure.   Plan  See back as needed ENroll in [employer] Colonoscopy as scheduled      Visit time: 50 minutes.   Domenic Moras FNP-BC

## 2022-11-16 NOTE — ED Provider Notes (Signed)
[place]    CSN: [id number] Arrival date & time: 11/12/22  1112      History   Chief Complaint Chief Complaint  Patient presents with   skin irritation    HPI Heather Vang is a 71 y.o. female.   Patient presents to urgent care for evaluation of skin irritation to the surrounding area of her colostomy. She first noticed the irritation/redness to the area 1 week ago when she was changing the dressing. Reports slight bleeding from the stoma as well. Denies fever/chills, purulent/yellow drainage from the site, pain, and leakage. Had a change in colostomy product and design 15 days ago. Has not had follow-up appointment for wound care with colostomy clinic. History of diabetes mellitus, states sugars are well controlled at this time.      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[place]) 54/65/0354   Sepsis ([place]) 07/02/2022   Hypertension    DM2 (diabetes mellitus, type 2) ([place])    Acquired hypothyroidism    Perforated diverticulum 07/01/2022   Diverticulitis of colon with perforation 07/01/2022   Left ankle sprain 06/03/2015    Past Surgical History:  Procedure Laterality Date   COSMETIC SURGERY     stomach    GALLBLADDER SURGERY  03/07/2014   LAPAROSCOPIC SMALL BOWEL RESECTION N/A 07/07/2022   Procedure: DIAGNOSTIC LAPAROSCOPY CONVERTED TO OPEN HARTMANN'S PROCEDURE;  Surgeon: Felicie Morn, MD;  Location: [facility];  Service: General;  Laterality: N/A;   SHOULDER SURGERY  06/06/2014    OB History   No obstetric history on file.      Home Medications    Prior to Admission medications   Medication Sig Start Date End Date Taking? Authorizing Provider  acetaminophen  (TYLENOL) 325 MG tablet Take 2 tablets (650 mg total) by mouth every 6 (six) hours as needed for mild pain or fever. 07/14/22   Norm Parcel, PA-C  estazolam (PROSOM) 2 MG tablet Take 2 mg by mouth at bedtime. Patient not taking: Reported on 11/12/2022    [provider]  levothyroxine (SYNTHROID) 125 MCG tablet Take 1 tablet (125 mcg total) by mouth daily before breakfast. 07/14/22   Oswald Hillock, MD  lidocaine (LIDODERM) 5 % Place 1 patch onto the skin daily. Remove & discard patch within 12 hours or as directed by MD 07/14/22   Norm Parcel, PA-C  metFORMIN (GLUCOPHAGE) 1000 MG tablet Take 1,000 mg by mouth daily.    [provider]  methocarbamol (ROBAXIN) 500 MG tablet Take 2 tablets (1,000 mg total) by mouth every 8 (eight) hours as needed for muscle spasms. Patient not taking: Reported on 11/12/2022 07/14/22   Norm Parcel, PA-C  oxyCODONE (OXY IR/ROXICODONE) 5 MG immediate release tablet Take 1 tablet (5 mg total) by mouth every 6 (six) hours as needed for moderate or severe pain. Patient not taking: Reported on 11/12/2022 07/14/22   Norm Parcel, PA-C    Family History Family History  Problem Relation Age of Onset   Diabetes Mother     Social History Social History   Tobacco Use   Smoking status: Never  Vaping Use   Vaping Use: Never used  Substance Use  Topics   Alcohol use: Never   Drug use: Never     Allergies   Patient has no known allergies.   Review of Systems Review of Systems Per HPI  Physical Exam Triage Vital Signs ED Triage Vitals  Enc Vitals Group     BP 11/12/22 1433 (!) 158/51     Pulse Rate 11/12/22 1433 67     Resp 11/12/22 1433 20     Temp 11/12/22 1433 98.1 F (36.7 C)     Temp Source 11/12/22 1433 Oral     SpO2 11/12/22 1433 95 %     Weight --      Height --      Head Circumference --      Peak Flow --      Pain Score 11/12/22 1428 6     Pain Loc --      Pain Edu? --      Excl. in [place]? --    No data  found.  Updated Vital Signs BP (!) 158/51 (BP Location: Left Arm)   Pulse 67   Temp 98.1 F (36.7 C) (Oral)   Resp 20   SpO2 95%   Visual Acuity Right Eye Distance:   Left Eye Distance:   Bilateral Distance:    Right Eye Near:   Left Eye Near:    Bilateral Near:     Physical Exam Vitals and nursing note reviewed.  Constitutional:      Appearance: She is not ill-appearing or toxic-appearing.  HENT:     Head: Normocephalic and atraumatic.     Right Ear: Hearing and external ear normal.     Left Ear: Hearing and external ear normal.     Nose: Nose normal.     Mouth/Throat:     Lips: Pink.  Eyes:     General: Lids are normal. Vision grossly intact. Gaze aligned appropriately.     Extraocular Movements: Extraocular movements intact.     Conjunctiva/sclera: Conjunctivae normal.  Cardiovascular:     Rate and Rhythm: Normal rate and regular rhythm.     Heart sounds: Normal heart sounds, S1 normal and S2 normal.  Pulmonary:     Effort: Pulmonary effort is normal. No respiratory distress.     Breath sounds: Normal breath sounds and air entry.  Abdominal:     General: Abdomen is flat. The ostomy site is clean. Bowel sounds are normal.     Palpations: Abdomen is soft.     Tenderness: There is no abdominal tenderness.    Musculoskeletal:     Cervical back: Neck supple.  Skin:    General: Skin is warm and dry.     Capillary Refill: Capillary refill takes less than 2 seconds.     Findings: No rash.  Neurological:     General: No focal deficit present.     Mental Status: She is alert and oriented to person, place, and time. Mental status is at baseline.     Cranial Nerves: No dysarthria or facial asymmetry.  Psychiatric:        Mood and Affect: Mood normal.        Speech: Speech normal.        Behavior: Behavior normal.        Thought Content: Thought content normal.        Judgment: Judgment normal.      UC Treatments / Results  Labs (all labs ordered are listed,  but only abnormal results are displayed) Labs Reviewed -  No data to display  EKG   Radiology No results found.  Procedures Procedures (including critical care time)  Medications Ordered in UC Medications - No data to display  Initial Impression / Assessment and Plan / UC Course  I have reviewed the triage vital signs and the nursing notes.  Pertinent labs & imaging results that were available during my care of the patient were reviewed by me and considered in my medical decision making (see chart for details).   1. Visit for wound check, colostomy complication Deferred full examination of the stoma as patient does not have all of the supplies needed to change the bag and the dressing in the clinic should she remove the current dressing. Called hospital pharmacy and colostomy care nurse at [facility] for advise to see if they are able to bring supplies over, they are unable to. Patient is nontoxic in appearance with hemodynamically stable vital signs. Visible areas surrounding the site appear to be without signs of infection, colostomy appears to be operating appropriately. Colostomy clinic called by nursing staff. Appointment scheduled for patient to be seen there first thing tomorrow morning at 9:30am by Colostomy Team for further management and evaluation of irritation. She is agreeable with this plan and knows where the clinic is. Strict ER return precautions discussed should she develop fever, chills, or significant leakage surrounding the site prior to appointment tomorrow morning at the colostomy clinic.   Discussed physical exam and available lab work findings in clinic with patient.  Counseled patient regarding appropriate use of medications and potential side effects for all medications recommended or prescribed today. Discussed red flag signs and symptoms of worsening condition,when to call the PCP office, return to urgent care, and when to seek higher level of care in the  emergency department. Patient verbalizes understanding and agreement with plan. All questions answered. Patient discharged in stable condition.    Final Clinical Impressions(s) / UC Diagnoses   Final diagnoses:  Visit for wound check  Colostomy complication, unspecified [facility])     Discharge Instructions      He programado una cita para que lo [place] en la clnica de colostoma [place] maana por la maana a las 9:30 a. m. Estn ubicados en el segundo [place]. Vaya a la entrada principal y pregunte por la Clnica de Colostoma. Ellos te mostrarn cmo llegar. Si su bolso comienza a gotear antes de la cita, cmbielo. [facility] [employer] para el cuidado de ostomas [place], [place], [place] a atencin de [place] segn sea necesario.   I have schedule an appointment for you to be seen at [place] clinic tomorrow morning at 9:30am. They are located on the second floor of [facility] in the main entrance and ask for the [facility]. They will show you how to get there.   If your bag starts to leak before the appointment, please change it.   [facility] for [facility] [address] [place], [place] [address]  Return to urgent care as needed.    ED Prescriptions   None    PDMP not reviewed this encounter.   Talbot Grumbling, [place] 11/16/22 1240

## 2022-11-18 ENCOUNTER — Telehealth: Payer: Self-pay | Admitting: *Deleted

## 2022-11-18 NOTE — Telephone Encounter (Signed)
Heather Vang called with patient present to interpret for patient with billing concern.  Advised all uninsured patients receive an automatic discount of 62% for services billed through [facility] and she would need to reach out to the number on the bill [phone] to explore any other options such as [place]. She verbalized understanding.

## 2022-12-14 ENCOUNTER — Encounter: Payer: Self-pay | Admitting: Physician Assistant

## 2022-12-14 ENCOUNTER — Ambulatory Visit (INDEPENDENT_AMBULATORY_CARE_PROVIDER_SITE_OTHER): Payer: Self-pay | Admitting: Physician Assistant

## 2022-12-14 VITALS — BP 120/70 | HR 78 | Ht 59.84 in | Wt 152.0 lb

## 2022-12-14 DIAGNOSIS — Z8719 Personal history of other diseases of the digestive system: Secondary | ICD-10-CM

## 2022-12-14 DIAGNOSIS — Z933 Colostomy status: Secondary | ICD-10-CM

## 2022-12-14 MED ORDER — NA SULFATE-K SULFATE-MG SULF 17.5-3.13-1.6 GM/177ML PO SOLN: 1.0000 | ORAL | 0 refills | Status: DC

## 2022-12-14 NOTE — Progress Notes (Signed)
Chief Complaint: History of diverticulitis with bowel perforation and abscess status post open Hartman's procedure  HPI:    Heather Vang is a 72 year old Hispanic speaking female with a past medical history as listed below including anxiety and diabetes as well as status post Hartman's procedure for perforated diverticulitis, status post colostomy, who was referred to me by Stechschulte, Nickola Major, MD for a history of diverticulitis with bowel perforation and status post Hartman's procedure.    07/07/2022 patient underwent diagnostic laparoscopic converted to open Hartman's procedure.    07/31/2022 patient seen in the urgent care for drainage and foul order from her incision site after previously being on Augmentin for 10 days 7/26-8/8.  At that time patient given Cipro and Flagyl.    08/05/2022 patient followed with the surgical team at that time recommended that she see [place] for colonoscopy as she had never had one before.  Ostomy takedown was planned for February 2024.    Today, the patient presents to clinic accompanied by her family and her niece who interprets for her.  She describes that she was told she needed a colonoscopy before they are able to takedown her colostomy.  She is eager to get this done and they have actually flown back from [place] just to have these procedures.  In general she is doing quite well other than some skin irritation around the actual site of colostomy.  Does tell me she had a colon about 15 to 18 years ago in [place] and thinks it was normal.  Denies any other GI complaints or concerns.    Her husband works as a Pharmacist, community in [place].    Denies fever, chills, weight loss, nausea, vomiting, abdominal pain or symptoms that awaken her from sleep.  Past Medical History:  Diagnosis Date   Anxiety    Diabetes mellitus without complication ([place])    Hypertension    Thyroid disease     Past Surgical History:  Procedure Laterality Date   COSMETIC SURGERY     stomach     GALLBLADDER SURGERY  03/07/2014   LAPAROSCOPIC SMALL BOWEL RESECTION N/A 07/07/2022   Procedure: DIAGNOSTIC LAPAROSCOPY CONVERTED TO OPEN HARTMANN'S PROCEDURE;  Surgeon: Felicie Morn, MD;  Location: [facility];  Service: General;  Laterality: N/A;   SHOULDER SURGERY  06/06/2014    Current Outpatient Medications  Medication Sig Dispense Refill   acetaminophen (TYLENOL) 325 MG tablet Take 2 tablets (650 mg total) by mouth every 6 (six) hours as needed for mild pain or fever.     estazolam (PROSOM) 2 MG tablet Take 2 mg by mouth at bedtime. (Patient not taking: Reported on 11/12/2022)     levothyroxine (SYNTHROID) 125 MCG tablet Take 1 tablet (125 mcg total) by mouth daily before breakfast. 30 tablet 3   lidocaine (LIDODERM) 5 % Place 1 patch onto the skin daily. Remove & discard patch within 12 hours or as directed by MD 30 patch 0   metFORMIN (GLUCOPHAGE) 1000 MG tablet Take 1,000 mg by mouth daily.     methocarbamol (ROBAXIN) 500 MG tablet Take 2 tablets (1,000 mg total) by mouth every 8 (eight) hours as needed for muscle spasms. (Patient not taking: Reported on 11/12/2022) 60 tablet 0   oxyCODONE (OXY IR/ROXICODONE) 5 MG immediate release tablet Take 1 tablet (5 mg total) by mouth every 6 (six) hours as needed for moderate or severe pain. (Patient not taking: Reported on 11/12/2022) 20 tablet 0   No current facility-administered medications for this visit.  Allergies as of 12/14/2022   (No Known Allergies)    Family History  Problem Relation Age of Onset   Diabetes Mother     Social History   Socioeconomic History   Marital status: Married    Spouse name: Not on file   Number of children: Not on file   Years of education: Not on file   Highest education level: Not on file  Occupational History   Not on file  Tobacco Use   Smoking status: Never   Smokeless tobacco: Not on file  Vaping Use   Vaping Use: Never used  Substance and Sexual Activity   Alcohol use: Never   Drug  use: Never   Sexual activity: Not on file  Other Topics Concern   Not on file  Social History Narrative   Not on file   Social Determinants of Health   Financial Resource Strain: Not on file  Food Insecurity: Not on file  Transportation Needs: Not on file  Physical Activity: Not on file  Stress: Not on file  Social Connections: Not on file  Intimate Partner Violence: Not on file    Review of Systems:    Constitutional: No weight loss, fever or chills Skin: No rash  Cardiovascular: No chest pain Respiratory: No SOB  Gastrointestinal: See HPI and otherwise negative Genitourinary: No dysuria  Neurological: No headache, dizziness or syncope Musculoskeletal: No new muscle or joint pain Hematologic: No bleeding Psychiatric: No history of depression or anxiety   Physical Exam:  Vital signs: BP 120/70   Pulse 78   Ht 4' 11.84" (1.52 m)   Wt 152 lb (68.9 kg)   BMI 29.84 kg/m    Constitutional:   Pleasant Hispanic female appears to be in NAD, Well developed, Well nourished, alert and cooperative Head:  Normocephalic and atraumatic. Eyes:   PEERL, EOMI. No icterus. Conjunctiva pink. Ears:  Normal auditory acuity. Neck:  Supple Throat: Oral cavity and pharynx without inflammation, swelling or lesion.  Respiratory: Respirations even and unlabored. Lungs clear to auscultation bilaterally.   No wheezes, crackles, or rhonchi.  Cardiovascular: Normal S1, S2. No MRG. Regular rate and rhythm. No peripheral edema, cyanosis or pallor.  Gastrointestinal:  Soft, nondistended, nontender. No rebound or guarding. Normal bowel sounds. No appreciable masses or hepatomegaly.+ healthy colostomy left side of abdomen Rectal:  Not performed.  Msk:  Symmetrical without gross deformities. Without edema, no deformity or joint abnormality.  Neurologic:  Alert and  oriented x4;  grossly normal neurologically.  Skin:   Dry and intact without significant lesions or rashes. Psychiatric:  Demonstrates good  judgement and reason without abnormal affect or behaviors.  RELEVANT LABS AND IMAGING: CBC    Component Value Date/Time   WBC 13.7 (H) 07/13/2022 0445   RBC 3.09 (L) 07/13/2022 0445   HGB 9.4 (L) 07/13/2022 0445   HCT 28.8 (L) 07/13/2022 0445   PLT 652 (H) 07/13/2022 0445   MCV 93.2 07/13/2022 0445   MCH 30.4 07/13/2022 0445   MCHC 32.6 07/13/2022 0445   RDW 15.0 07/13/2022 0445   LYMPHSABS 1.8 07/02/2022 0033   MONOABS 0.9 07/02/2022 0033   EOSABS 0.0 07/02/2022 0033   BASOSABS 0.1 07/02/2022 0033    CMP     Component Value Date/Time   NA 139 07/13/2022 0445   K 3.7 07/13/2022 0445   CL 109 07/13/2022 0445   CO2 22 07/13/2022 0445   GLUCOSE 118 (H) 07/13/2022 0445   BUN <5 (L) 07/13/2022 0445  CREATININE 0.43 (L) 07/13/2022 0445   CALCIUM 8.2 (L) 07/13/2022 0445   PROT 6.3 (L) 07/11/2022 0552   ALBUMIN 2.3 (L) 07/11/2022 0552   AST 13 (L) 07/11/2022 0552   ALT 16 07/11/2022 0552   ALKPHOS 48 07/11/2022 0552   BILITOT 0.5 07/11/2022 0552   GFRNONAA >60 07/13/2022 0445    Assessment: 1.  History of perforated diverticulitis status post colostomy: Surgery in August of last year, surgical team planning ostomy takedown soon and would like colonoscopy first  Plan: 1.  Patient scheduled for colonoscopy with Dr. Lorenso Courier on Friday of this week as this is the soonest appointment available to fit her schedule.  Did provide the patient a detailed list of risks for the procedure and she agrees to proceed.  Dr. Lorenso Courier will be her supervising physician here if she ever returns. Patient is appropriate for endoscopic procedure(s) in the ambulatory ([place]) setting.  2.  Instructed the patient's niece/patient to call the surgical office once we have appointment set so that they are aware and can schedule her ostomy takedown. 3.  Patient to follow in clinic per recommendations after time of procedure.  Ellouise Newer, PA-C [facility] 12/14/2022, 2:21 PM  Cc: Stechschulte,  Nickola Major, MD

## 2022-12-14 NOTE — Progress Notes (Signed)
I agree with the assessment and plan as outlined by Ms. Lemmon. 

## 2022-12-14 NOTE — Patient Instructions (Addendum)
Le han programado una colonoscopia. Siga las instrucciones escritas que se le dieron en su visita de hoy. Recoja sus suministros de preparacin en la farmacia dentro de los prximos 1 a 3 das. Si [place] inhaladores (incluso solo cuando sea necesario), trigalos con usted el da de su procedimiento.  Hemos enviado los siguientes medicamentos a su farmacia para que los recoja cuando le convenga: suprema ([place] puede usar la tarjeta GoodRX si hay algn problema con su seguro.  Debido a cambios recientes en las leyes de atencin mdica, es posible que vea los [place] de sus estudios de imgenes y de laboratorio en MyChart antes de que su proveedor haya tenido la oportunidad de revisarlos. Entendemos que en algunos casos puede haber resultados que sean confusos o preocupantes para usted. No todos los resultados de laboratorio llegan en el mismo perodo de tiempo y es posible que el proveedor est esperando varios resultados para Primary school teacher. Dnos 42 horas para que su proveedor revise minuciosamente todos los resultados antes de comunicarse con la oficina para Audiological scientist.  Gracias por elegirme a m y a [facility].  Ellouise Newer PA-C    ______________________________________________________________________   Heather Vang have been scheduled for a colonoscopy. Please follow written instructions given to you at your visit today.  Please pick up your prep supplies at the pharmacy within the next 1-3 days. If you use inhalers (even only as needed), please bring them with you on the day of your procedure.  We have sent the following medications to your pharmacy for you to pick up at your convenience: Suprep  ( You may also use GoodRX card if there is an issue with your insurance.   Due to recent changes in healthcare laws, you may see the results of your imaging and laboratory studies on MyChart before your provider has had a chance to review them.  We understand that in  some cases there may be results that are confusing or concerning to you. Not all laboratory results come back in the same time frame and the provider may be waiting for multiple results in order to interpret others.  Please give [place] 48 hours in order for your provider to thoroughly review all the results before contacting the office for clarification of your results.   Thank you for choosing me and [facility].  Ellouise Newer PA-C

## 2022-12-18 ENCOUNTER — Encounter: Payer: Self-pay | Admitting: Internal Medicine

## 2022-12-18 ENCOUNTER — Ambulatory Visit (AMBULATORY_SURGERY_CENTER): Payer: Self-pay | Admitting: Internal Medicine

## 2022-12-18 VITALS — BP 130/56 | HR 75 | Temp 97.8°F | Resp 12 | Ht 59.0 in | Wt 152.0 lb

## 2022-12-18 DIAGNOSIS — D122 Benign neoplasm of ascending colon: Secondary | ICD-10-CM

## 2022-12-18 DIAGNOSIS — D123 Benign neoplasm of transverse colon: Secondary | ICD-10-CM

## 2022-12-18 DIAGNOSIS — Z8719 Personal history of other diseases of the digestive system: Secondary | ICD-10-CM

## 2022-12-18 MED ORDER — SODIUM CHLORIDE 0.9 % IV SOLN: 500.0000 mL | INTRAVENOUS | Status: DC

## 2022-12-18 NOTE — Progress Notes (Signed)
GASTROENTEROLOGY PROCEDURE H&P NOTE   Primary Care Physician: Pcp, No    Reason for Procedure:   History of diverticulitis  Plan:    Colonoscopy  Patient is appropriate for endoscopic procedure(s) in the ambulatory ([place]) setting.  The nature of the procedure, as well as the risks, benefits, and alternatives were carefully and thoroughly reviewed with the patient. Ample time for discussion and questions allowed. The patient understood, was satisfied, and agreed to proceed.     HPI: Heather Vang is a 72 y.o. female who presents for colonoscopy for evaluation of history of diverticulitis .  Patient was most recently seen in the Gastroenterology Clinic on 12/14/22.  No interval change in medical history since that appointment. Please refer to that note for full details regarding GI history and clinical presentation.   Past Medical History:  Diagnosis Date   Anxiety    Diabetes mellitus without complication ([place])    Hypertension    Thyroid disease     Past Surgical History:  Procedure Laterality Date   COSMETIC SURGERY     stomach    GALLBLADDER SURGERY  03/07/2014   LAPAROSCOPIC SMALL BOWEL RESECTION N/A 07/07/2022   Procedure: DIAGNOSTIC LAPAROSCOPY CONVERTED TO OPEN HARTMANN'S PROCEDURE;  Surgeon: Felicie Morn, MD;  Location: [facility];  Service: General;  Laterality: N/A;   SHOULDER SURGERY  06/06/2014    Prior to Admission medications   Medication Sig Start Date End Date Taking? Authorizing Provider  acetaminophen (TYLENOL) 325 MG tablet Take 2 tablets (650 mg total) by mouth every 6 (six) hours as needed for mild pain or fever. Patient not taking: Reported on 12/14/2022 07/14/22   Norm Parcel, PA-C  estazolam (PROSOM) 2 MG tablet Take 2 mg by mouth at bedtime. Patient not taking: Reported on 12/14/2022    [provider]  levothyroxine (SYNTHROID) 125 MCG tablet Take 1 tablet (125 mcg total) by mouth daily before breakfast. Patient not taking:  Reported on 12/14/2022 07/14/22   Oswald Hillock, MD  lidocaine (LIDODERM) 5 % Place 1 patch onto the skin daily. Remove & discard patch within 12 hours or as directed by MD Patient not taking: Reported on 12/14/2022 07/14/22   Norm Parcel, PA-C  metFORMIN (GLUCOPHAGE) 1000 MG tablet Take 1,000 mg by mouth daily.    [provider]  methocarbamol (ROBAXIN) 500 MG tablet Take 2 tablets (1,000 mg total) by mouth every 8 (eight) hours as needed for muscle spasms. Patient not taking: Reported on 12/14/2022 07/14/22   Barkley Boards R, PA-C  Na Sulfate-K Sulfate-Mg Sulf (SUPREP BOWEL PREP KIT) 17.5-3.13-1.6 GM/177ML SOLN Take 1 kit by mouth as directed. For colonoscopy prep 12/14/22   Levin Erp, PA  oxyCODONE (OXY IR/ROXICODONE) 5 MG immediate release tablet Take 1 tablet (5 mg total) by mouth every 6 (six) hours as needed for moderate or severe pain. Patient not taking: Reported on 12/14/2022 07/14/22   Norm Parcel, PA-C    Current Outpatient Medications  Medication Sig Dispense Refill   acetaminophen (TYLENOL) 325 MG tablet Take 2 tablets (650 mg total) by mouth every 6 (six) hours as needed for mild pain or fever. (Patient not taking: Reported on 12/14/2022)     estazolam (PROSOM) 2 MG tablet Take 2 mg by mouth at bedtime. (Patient not taking: Reported on 12/14/2022)     levothyroxine (SYNTHROID) 125 MCG tablet Take 1 tablet (125 mcg total) by mouth daily before breakfast. (Patient not taking: Reported on 12/14/2022) 30 tablet  3   lidocaine (LIDODERM) 5 % Place 1 patch onto the skin daily. Remove & discard patch within 12 hours or as directed by MD (Patient not taking: Reported on 12/14/2022) 30 patch 0   metFORMIN (GLUCOPHAGE) 1000 MG tablet Take 1,000 mg by mouth daily.     methocarbamol (ROBAXIN) 500 MG tablet Take 2 tablets (1,000 mg total) by mouth every 8 (eight) hours as needed for muscle spasms. (Patient not taking: Reported on 12/14/2022) 60 tablet 0   Na Sulfate-K Sulfate-Mg Sulf  (SUPREP BOWEL PREP KIT) 17.5-3.13-1.6 GM/177ML SOLN Take 1 kit by mouth as directed. For colonoscopy prep 354 mL 0   oxyCODONE (OXY IR/ROXICODONE) 5 MG immediate release tablet Take 1 tablet (5 mg total) by mouth every 6 (six) hours as needed for moderate or severe pain. (Patient not taking: Reported on 12/14/2022) 20 tablet 0   No current facility-administered medications for this visit.    Allergies as of 12/18/2022   (No Known Allergies)    Family History  Problem Relation Age of Onset   Diabetes Mother     Social History   Socioeconomic History   Marital status: Married    Spouse name: Not on file   Number of children: 5   Years of education: Not on file   Highest education level: Not on file  Occupational History   Not on file  Tobacco Use   Smoking status: Never   Smokeless tobacco: Not on file  Vaping Use   Vaping Use: Never used  Substance and Sexual Activity   Alcohol use: Never   Drug use: Never   Sexual activity: Not on file  Other Topics Concern   Not on file  Social History Narrative   Not on file   Social Determinants of Health   Financial Resource Strain: Not on file  Food Insecurity: Not on file  Transportation Needs: Not on file  Physical Activity: Not on file  Stress: Not on file  Social Connections: Not on file  Intimate Partner Violence: Not on file    Physical Exam: Vital signs in last 24 hours: BP 125/79   Pulse 91   Temp 97.8 F (36.6 C)   Ht '4\' 11"'$  (1.499 m)   Wt 152 lb (68.9 kg)   LMP  (LMP Unknown) Comment: does not have menstual periods anymore  SpO2 95%   BMI 30.70 kg/m  GEN: NAD EYE: Sclerae anicteric ENT: MMM CV: Non-tachycardic Pulm: No increased WOB GI: Soft NEURO:  Alert & Oriented   Christia Reading, MD [facility]   12/18/2022 1:26 PM

## 2022-12-18 NOTE — Op Note (Signed)
[place] Patient Name: Heather Vang Procedure Date: 12/18/2022 1:42 PM MRN: [id number] Endoscopist: Georgian Co , , [id number] Age: 72 Referring MD:  Date of Birth: [birth year] Gender: Female Account #: [id number] Procedure:                Colonoscopy Indications:              Follow-up of diverticulitis Medicines:                Monitored Anesthesia Care Procedure:                Pre-Anesthesia Assessment:                           - Prior to the procedure, a History and Physical                            was performed, and patient medications and                            allergies were reviewed. The patient's tolerance of                            previous anesthesia was also reviewed. The risks                            and benefits of the procedure and the sedation                            options and risks were discussed with the patient.                            All questions were answered, and informed consent                            was obtained. Prior Anticoagulants: The patient has                            taken no anticoagulant or antiplatelet agents. ASA                            Grade Assessment: III - A patient with severe                            systemic disease. After reviewing the risks and                            benefits, the patient was deemed in satisfactory                            condition to undergo the procedure.                           After obtaining informed consent, the colonoscope  was passed under direct vision. Throughout the                            procedure, the patient's blood pressure, pulse, and                            oxygen saturations were monitored continuously. The                            Olympus CF-HQ190L [id number]) Colonoscope was                            introduced through the transverse colostomy and                            advanced to the the terminal  ileum. The colonoscope                            was then withdrawn and re-introduced through the                            anus and advanced to the sigmoid colon. The                            colonoscopy was performed without difficulty. The                            patient tolerated the procedure well. The quality                            of the bowel preparation was good. The terminal                            ileum, ileocecal valve, appendiceal orifice, and                            rectum were photographed. Rectal retroflexion was                            not performed due to narrow rectal canal. Scope In: 1:51:56 PM Scope Out: 2:10:57 PM Scope Withdrawal Time: 0 hours 16 minutes 45 seconds  Total Procedure Duration: 0 hours 19 minutes 1 second  Findings:                 The terminal ileum appeared normal.                           Two sessile polyps were found in the transverse                            colon and ascending colon. The polyps were 3 to 6                            mm in size. These polyps were  removed with a cold                            snare. Resection and retrieval were complete.                           Localized mucosal changes characterized by                            congestion (edema), granularity and mucus were                            found in the rectum and in the sigmoid colon.                            Biopsies were taken with a cold forceps for                            histology.                           Stool was found in the recto-sigmoid colon, which                            was removed by fecal disimpaction Complications:            No immediate complications. Estimated Blood Loss:     Estimated blood loss was minimal. Impression:               - The examined portion of the ileum was normal.                           - Two 3 to 6 mm polyps in the transverse colon and                            in the ascending colon,  removed with a cold snare.                            Resected and retrieved.                           - Localized mucosal changes were found in the                            rectum and in the sigmoid colon. Biopsied. Recommendation:           - Discharge patient to home (with escort).                           - Await pathology results.                           - Return to Dr. Thermon Leyland at appointment to be  scheduled to be considered for ostomy reversal.                           - The findings and recommendations were discussed                            with the patient. Dr Georgian Co "Lyndee Leo" Lorenso Courier,  12/18/2022 2:20:00 PM

## 2022-12-18 NOTE — Patient Instructions (Signed)
Handout on polyps given.  Return to Dr. Leward Quan at appointment to be scheduled to be considered for ostomy reversal.    USTED TUVO UN PROCEDIMIENTO ENDOSCPICO [place]:   Lea el informe del procedimiento que se le entreg para cualquier pregunta especfica sobre lo que se Primary school teacher.  Si el informe del examen no responde a sus preguntas, por favor llame a su gastroenterlogo para aclararlo.  Si usted solicit que no se le den [employer] de lo que se Estate manager/land agent en su procedimiento al [employer] va a cuidar, entonces el informe del procedimiento se ha incluido en un sobre sellado para que usted lo revise despus cuando le sea ms conveniente.   LO QUE PUEDE ESPERAR: Algunas sensaciones de hinchazn en el abdomen.  Puede tener ms gases de lo normal.  El caminar puede ayudarle a eliminar el aire que se le puso en el tracto gastrointestinal durante el procedimiento y reducir la hinchazn.  Si le hicieron una endoscopia inferior (como una colonoscopia o una sigmoidoscopia flexible), podra notar manchas de sangre en las heces fecales o en el papel higinico.  Si se someti a una preparacin intestinal para su procedimiento, es posible que no tenga una evacuacin intestinal normal durante [employer].   Tenga en cuenta:  Es posible que note un poco de irritacin y congestin en la nariz o algn drenaje.  Esto es debido al oxgeno [employer] durante su procedimiento.  No hay que preocuparse y esto debe desaparecer ms o Scientist, research (medical).   SNTOMAS PARA REPORTAR INMEDIATAMENTE:  Despus de una endoscopia inferior (colonoscopia o sigmoidoscopia flexible):  Cantidades excesivas de sangre en las heces fecales  Sensibilidad significativa o empeoramiento de los dolores abdominales   Hinchazn aguda del abdomen que antes no tena   Fiebre de 100F o ms   Para asuntos urgentes o de Freight forwarder, puede comunicarse con un gastroenterlogo a cualquier hora llamando  al [phone].  DIETA:  Recomendamos una comida pequea al principio, pero luego puede continuar con su dieta normal.  Tome muchos lquidos, Teacher, adult education las bebidas alcohlicas durante 24 horas.    ACTIVIDAD:  Debe planear tomarse las cosas con calma por el resto del da y no debe CONDUCIR ni usar maquinaria pesada Programmer, applications (debido a los medicamentos de sedacin utilizados durante el examen).     SEGUIMIENTO: Nuestro personal llamar al nmero que aparece en su historial al siguiente da hbil de su procedimiento para ver cmo se siente y para responder cualquier pregunta o inquietud que pueda tener con respecto a la informacin que se le dio despus del procedimiento. Si no podemos contactarle, le dejaremos un mensaje.  Sin embargo, si se siente bien y no tiene Paediatric nurse, no es necesario que nos devuelva la llamada.  Asumiremos que ha regresado a sus actividades diarias normales sin incidentes. Si se le tomaron algunas biopsias, le contactaremos por telfono o por carta en las prximas 3 semanas.  Si no ha sabido [employer] biopsias en el transcurso de 3 semanas, por favor llmenos al [phone].   FIRMAS/CONFIDENCIALIDAD: Usted y/o el acompaante que le cuide han firmado documentos que se ingresarn en su historial mdico electrnico.  Estas firmas atestiguan el hecho de que la informacin anterior

## 2022-12-18 NOTE — Progress Notes (Signed)
Called to room to assist during endoscopic procedure.  Patient ID and intended procedure confirmed with present staff. Received instructions for my participation in the procedure from the performing physician.

## 2022-12-18 NOTE — Progress Notes (Signed)
A and O x3. Report to RN. Tolerated MAC anesthesia well. 

## 2022-12-18 NOTE — Progress Notes (Addendum)
Interpreter, Cresenciano Genre, present during recovery.

## 2022-12-21 ENCOUNTER — Telehealth: Payer: Self-pay

## 2022-12-21 ENCOUNTER — Telehealth: Payer: Self-pay | Admitting: *Deleted

## 2022-12-21 NOTE — Telephone Encounter (Signed)
Post procedure follow up call placed, no answer and VM isn't set up.

## 2022-12-21 NOTE — Telephone Encounter (Signed)
Referral to Dr Thermon Leyland for consideration of ostomy reversal.

## 2022-12-28 ENCOUNTER — Encounter: Payer: Self-pay | Admitting: Internal Medicine

## 2023-01-05 ENCOUNTER — Ambulatory Visit: Payer: Self-pay | Admitting: Surgery

## 2023-01-05 DIAGNOSIS — Z01818 Encounter for other preprocedural examination: Secondary | ICD-10-CM

## 2023-01-05 NOTE — Progress Notes (Signed)
Surgery orders requested via Epic inbox. °

## 2023-01-11 NOTE — Progress Notes (Addendum)
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   Hgba1c- 01/12/23-  6.4  Spanish interpreter present at preop appt on 01/12/23.  Reviewed bowel prep instructions along with preop instructions x 3 at time of preop appt.  PT brought with her the Miralax and etc to preop.  Reviewed with her what to do with each thing.  PT has a niece that speaks and understandis written and verbal English.  Proep niurse to call niece prior to surgery and review with her all of preop instructions.  PT and niece aware.  Have niece's phone number and name.   Called and spoke with niece on 01/20/23.  Niece to call me back on 01/20/23 to review preop instructons of pt.  Pt's niece called back on 01/20/23 and preop instructions along with bowel prep instructions were reviewed with niece. Niece voiced understanding and had no questions.  Niece stated that she had reviewed with pt last week the preop instructions and that she would review again with pt prior to surgery.

## 2023-01-11 NOTE — Patient Instructions (Signed)
SURGICAL WAITING ROOM VISITATION  Patients having surgery or a procedure may have no more than 2 support people in the waiting area - these visitors may rotate.    Children under the age of 75 must have an adult with them who is not the patient.  Due to an increase in RSV and influenza rates and associated hospitalizations, children ages 11 and under may not visit patients in [address].  If the patient needs to stay at the hospital during part of their recovery, the visitor guidelines for inpatient rooms apply. Pre-op nurse will coordinate an appropriate time for 1 support person to accompany patient in pre-op.  This support person may not rotate.    Please refer to the [facility] website for the visitor guidelines for Inpatients (after your surgery is over and you are in a regular room).       Your procedure is scheduled on:  01/25/2023    Report to [facility] Main Entrance    Report to admitting at   1130AM   Call this number if you have problems the morning of surgery [phone]   Do not eat food :After Midnight.   After Midnight you may have the following liquids until __ 1030____ AM OF SURGERY  Water Non-Citrus Juices (without pulp, NO RED-Apple, White grape, White cranberry) Black Coffee (NO MILK/CREAM OR CREAMERS, sugar ok)  Clear Tea (NO MILK/CREAM OR CREAMERS, sugar ok) regular and decaf                             Plain Jell-O (NO RED)                                           Fruit ices (not with fruit pulp, NO RED)                                     Popsicles (NO RED)                                                               Sports drinks like Gatorade (NO RED)              .        The day of surgery:  Drink ONE (1) Pre-Surgery Clear Ensure or G2 at  1030 AM ( have completed by )  the morning of surgery. Drink in one sitting. Do not sip.  This drink was given to you during your hospital  pre-op appointment visit. Nothing else to  drink after completing the  Pre-Surgery Clear Ensure or G2.          If you have questions, please contact your surgeon's office.   FOLLOW BOWEL PREP AND ANY ADDITIONAL PRE OP INSTRUCTIONS YOU RECEIVED FROM YOUR SURGEON'S OFFICE!!!     Oral Hygiene is also important to reduce your risk of infection.  Remember - BRUSH YOUR TEETH THE MORNING OF SURGERY WITH YOUR REGULAR TOOTHPASTE  DENTURES WILL BE REMOVED PRIOR TO SURGERY PLEASE DO NOT APPLY "Poly grip" OR ADHESIVES!!!   Do NOT smoke after Midnight   Take these medicines the morning of surgery with A SIP OF WATER:  synthroid   DO NOT TAKE ANY ORAL DIABETIC MEDICATIONS DAY OF YOUR SURGERY  Bring CPAP mask and tubing day of surgery.                              You may not have any metal on your body including hair pins, jewelry, and body piercing             Do not wear make-up, lotions, powders, perfumes/cologne, or deodorant  Do not wear nail polish including gel and S&S, artificial/acrylic nails, or any other type of covering on natural nails including finger and toenails. If you have artificial nails, gel coating, etc. that needs to be removed by a nail salon please have this removed prior to surgery or surgery may need to be canceled/ delayed if the surgeon/ anesthesia feels like they are unable to be safely monitored.   Do not shave  48 hours prior to surgery.               Men may shave face and neck.   Do not bring valuables to the hospital. [place].   Contacts, glasses, dentures or bridgework may not be worn into surgery.   Bring small overnight bag day of surgery.   DO NOT [place]. PHARMACY WILL DISPENSE MEDICATIONS LISTED ON YOUR MEDICATION LIST TO YOU DURING YOUR ADMISSION [place]!    Patients discharged on the day of surgery will not be allowed to drive home.  Someone NEEDS to stay with  you for the first 24 hours after anesthesia.   Special Instructions: Bring a copy of your healthcare power of attorney and living will documents the day of surgery if you haven't scanned them before.              Please read over the following fact sheets you were given: IF [place] [phone]   If you received a COVID test during your pre-op visit  it is requested that you wear a mask when out in public, stay away from anyone that may not be feeling well and notify your surgeon if you develop symptoms. If you test positive for Covid or have been in contact with anyone that has tested positive in the last 10 days please notify you surgeon.    [place] - Preparing for Surgery Before surgery, you can play an important role.  Because skin is not sterile, your skin needs to be as free of germs as possible.  You can reduce the number of germs on your skin by washing with CHG (chlorahexidine gluconate) soap before surgery.  CHG is an antiseptic cleaner which kills germs and bonds with the skin to continue killing germs even after washing. Please DO NOT use if you have an allergy to CHG or antibacterial soaps.  If your skin becomes reddened/irritated stop using the CHG and inform your nurse when you arrive at Short Stay. Do not shave (including legs and underarms) for  at least 48 hours prior to the first CHG shower.  You may shave your face/neck. Please follow these instructions carefully:  1.  Shower with CHG Soap the night before surgery and the  morning of Surgery.  2.  If you choose to wash your hair, wash your hair first as usual with your  normal  shampoo.  3.  After you shampoo, rinse your hair and body thoroughly to remove the  shampoo.                           4.  Use CHG as you would any other liquid soap.  You can apply chg directly  to the skin and wash                       Gently with a scrungie or clean washcloth.  5.  Apply the CHG  Soap to your body ONLY FROM THE NECK DOWN.   Do not use on face/ open                           Wound or open sores. Avoid contact with eyes, ears mouth and genitals (private parts).                       Wash face,  Genitals (private parts) with your normal soap.             6.  Wash thoroughly, paying special attention to the area where your surgery  will be performed.  7.  Thoroughly rinse your body with warm water from the neck down.  8.  DO NOT shower/wash with your normal soap after using and rinsing off  the CHG Soap.                9.  Pat yourself dry with a clean towel.            10.  Wear clean pajamas.            11.  Place clean sheets on your bed the night of your first shower and do not  sleep with pets. Day of Surgery : Do not apply any lotions/deodorants the morning of surgery.  Please wear clean clothes to the hospital/surgery center.  FAILURE TO FOLLOW THESE INSTRUCTIONS MAY RESULT IN THE CANCELLATION OF YOUR SURGERY PATIENT SIGNATURE_________________________________  NURSE SIGNATURE__________________________________  ________________________________________________________________________

## 2023-01-12 ENCOUNTER — Encounter (HOSPITAL_COMMUNITY): Payer: Self-pay

## 2023-01-12 ENCOUNTER — Other Ambulatory Visit: Payer: Self-pay

## 2023-01-12 ENCOUNTER — Encounter (HOSPITAL_COMMUNITY)
Admission: RE | Admit: 2023-01-12 | Discharge: 2023-01-12 | Disposition: A | Discharge status: Home / Self Care | Payer: Self-pay | Source: Ambulatory Visit | Attending: Surgery | Admitting: Surgery

## 2023-01-12 DIAGNOSIS — Z01818 Encounter for other preprocedural examination: Secondary | ICD-10-CM

## 2023-01-12 DIAGNOSIS — Z01812 Encounter for preprocedural laboratory examination: Secondary | ICD-10-CM | POA: Insufficient documentation

## 2023-01-12 HISTORY — DX: Hypothyroidism, unspecified: E03.9

## 2023-01-12 LAB — CBC
HCT: 45 % (ref 36.0–46.0)
Hemoglobin: 14.4 g/dL (ref 12.0–15.0)
MCH: 28.8 pg (ref 26.0–34.0)
MCHC: 32 g/dL (ref 30.0–36.0)
MCV: 90 fL (ref 80.0–100.0)
Platelets: 345 10*3/uL (ref 150–400)
RBC: 5 MIL/uL (ref 3.87–5.11)
RDW: 17.2 % — ABNORMAL HIGH (ref 11.5–15.5)
WBC: 9.3 10*3/uL (ref 4.0–10.5)
nRBC: 0 % (ref 0.0–0.2)

## 2023-01-12 LAB — BASIC METABOLIC PANEL
Anion gap: 10 (ref 5–15)
BUN: 14 mg/dL (ref 8–23)
CO2: 25 mmol/L (ref 22–32)
Calcium: 9.5 mg/dL (ref 8.9–10.3)
Chloride: 106 mmol/L (ref 98–111)
Creatinine, Ser: 0.57 mg/dL (ref 0.44–1.00)
GFR, Estimated: >60 mL/min (ref >60)
Glucose, Bld: 101 mg/dL — ABNORMAL HIGH (ref 70–99)
Potassium: 4.2 mmol/L (ref 3.5–5.1)
Sodium: 141 mmol/L (ref 135–145)

## 2023-01-12 LAB — GLUCOSE, CAPILLARY: Glucose-Capillary: 105 mg/dL — ABNORMAL HIGH (ref 70–99)

## 2023-01-12 LAB — HEMOGLOBIN A1C
Hgb A1c MFr Bld: 6.4 % — ABNORMAL HIGH (ref 4.8–5.6)
Mean Plasma Glucose: 136.98 mg/dL

## 2023-01-13 ENCOUNTER — Encounter (HOSPITAL_COMMUNITY): Payer: Self-pay | Admitting: Anesthesiology

## 2023-01-13 ENCOUNTER — Encounter (HOSPITAL_COMMUNITY): Payer: Self-pay | Admitting: Physician Assistant

## 2023-01-24 NOTE — Anesthesia Preprocedure Evaluation (Signed)
Anesthesia Evaluation    Reviewed: Allergy & Precautions, Patient's Chart, lab work & pertinent test results  Airway        Dental   Pulmonary former smoker          Cardiovascular hypertension,      Neuro/Psych   Anxiety     negative neurological ROS     GI/Hepatic Neg liver ROS,,,  Endo/Other  diabetesHypothyroidism    Renal/GU Lab Results      Component                Value               Date                      CREATININE               0.57                01/12/2023                       K                        4.2                 01/12/2023                    Musculoskeletal   Abdominal   Peds  Hematology Lab Results      Component                Value               Date                             HGB                      14.4                01/12/2023                HCT                      45.0                01/12/2023                PLT                      345                 01/12/2023              Anesthesia Other Findings   Reproductive/Obstetrics                             Anesthesia Physical Anesthesia Plan  ASA: 3  Anesthesia Plan: General   Post-op Pain Management: Lidocaine infusion*, Tylenol PO (pre-op)*, Toradol IV (intra-op)* and Ketamine IV*   Induction: Intravenous  PONV Risk Score and Plan: 4 or greater and Treatment may vary due to age or medical condition, Midazolam and Ondansetron  Airway Management Planned: Oral ETT  Additional Equipment: None  Intra-op Plan:   Post-operative Plan: Extubation in OR  Informed Consent:  Dental advisory given  Plan Discussed with:   Anesthesia Plan Comments:        Anesthesia Quick Evaluation

## 2023-01-25 ENCOUNTER — Encounter (HOSPITAL_COMMUNITY): Admission: RE | Payer: Self-pay | Source: Ambulatory Visit

## 2023-01-25 ENCOUNTER — Inpatient Hospital Stay (HOSPITAL_COMMUNITY): Admission: RE | Admit: 2023-01-25 | Payer: Self-pay | Source: Ambulatory Visit | Admitting: Surgery

## 2023-01-25 SURGERY — CLOSURE, COLOSTOMY, ROBOT-ASSISTED
Anesthesia: General

## 2023-01-26 NOTE — Progress Notes (Signed)
Spoke to patient's niece Alleen Borne regarding rescheduled surgery.  Patient now scheduled for surgery on 01-28-23.  Alleen Borne was advised that patient needs to arrive at 10:45 at admitting.  Reminded that patient needs to be on a clear liquid diet 01-27-23 while doing bowel prep, and that patient can have liquids until 10:00 am day of surgery.  Alleen Borne was advised that per [name] at Dr. Robby Sermon office new prescriptions for bowel prep will be sent to pharmacy today.  Alleen Borne stated that she has the bowel prep instructions and previous instructions from her presurgical visit.  She was given phone numbers to call at [facility] Presurgical testing as well as Dr. Robby Sermon office.  Alleen Borne stated understanding and will call back if any questions.  Interpreter requested for day of surgery with [facility] interpreting, awaiting confirmation.

## 2023-01-26 NOTE — Progress Notes (Signed)
Spoke to Heather Vang at Dr. Robby Sermon office regarding bowel prep.  Patient's surgery was r/s from 01-25-23 to 01-28-23.  Patient completed bowel prep on Sunday and will need new prescriptions.    [name] will send in new prescriptions for bowel prep, patient's niece aware.

## 2023-01-28 ENCOUNTER — Other Ambulatory Visit: Payer: Self-pay

## 2023-01-28 ENCOUNTER — Inpatient Hospital Stay (HOSPITAL_COMMUNITY)
Admission: RE | Admit: 2023-01-28 | Discharge: 2023-02-01 | DRG: 331 | Disposition: A | Discharge status: Home / Self Care | Payer: Self-pay | Attending: Surgery | Admitting: Surgery

## 2023-01-28 ENCOUNTER — Inpatient Hospital Stay (HOSPITAL_COMMUNITY): Payer: Self-pay | Admitting: Certified Registered Nurse Anesthetist

## 2023-01-28 ENCOUNTER — Encounter (HOSPITAL_COMMUNITY)
Admission: RE | Disposition: A | Discharge status: Home / Self Care | Payer: Self-pay | Source: Home / Self Care | Attending: Surgery

## 2023-01-28 ENCOUNTER — Encounter (HOSPITAL_COMMUNITY): Payer: Self-pay | Admitting: Surgery

## 2023-01-28 DIAGNOSIS — Z7984 Long term (current) use of oral hypoglycemic drugs: Secondary | ICD-10-CM

## 2023-01-28 DIAGNOSIS — Z980 Intestinal bypass and anastomosis status: Secondary | ICD-10-CM

## 2023-01-28 DIAGNOSIS — Z433 Encounter for attention to colostomy: Secondary | ICD-10-CM

## 2023-01-28 DIAGNOSIS — Z79890 Hormone replacement therapy (postmenopausal): Secondary | ICD-10-CM

## 2023-01-28 DIAGNOSIS — I10 Essential (primary) hypertension: Secondary | ICD-10-CM | POA: Diagnosis present

## 2023-01-28 DIAGNOSIS — F419 Anxiety disorder, unspecified: Secondary | ICD-10-CM | POA: Diagnosis present

## 2023-01-28 DIAGNOSIS — Z939 Artificial opening status, unspecified: Principal | ICD-10-CM

## 2023-01-28 DIAGNOSIS — Z7982 Long term (current) use of aspirin: Secondary | ICD-10-CM

## 2023-01-28 DIAGNOSIS — Z87891 Personal history of nicotine dependence: Secondary | ICD-10-CM

## 2023-01-28 DIAGNOSIS — E039 Hypothyroidism, unspecified: Secondary | ICD-10-CM | POA: Diagnosis present

## 2023-01-28 DIAGNOSIS — K660 Peritoneal adhesions (postprocedural) (postinfection): Secondary | ICD-10-CM | POA: Diagnosis present

## 2023-01-28 DIAGNOSIS — Z9049 Acquired absence of other specified parts of digestive tract: Secondary | ICD-10-CM

## 2023-01-28 DIAGNOSIS — Z79899 Other long term (current) drug therapy: Secondary | ICD-10-CM

## 2023-01-28 DIAGNOSIS — Z01818 Encounter for other preprocedural examination: Secondary | ICD-10-CM

## 2023-01-28 DIAGNOSIS — E119 Type 2 diabetes mellitus without complications: Secondary | ICD-10-CM | POA: Diagnosis present

## 2023-01-28 DIAGNOSIS — Z833 Family history of diabetes mellitus: Secondary | ICD-10-CM

## 2023-01-28 HISTORY — PX: XI ROBOTIC ASSISTED COLOSTOMY TAKEDOWN: SHX6828

## 2023-01-28 LAB — CREATININE, SERUM
Creatinine, Ser: 0.66 mg/dL (ref 0.44–1.00)
GFR, Estimated: >60 mL/min (ref >60)

## 2023-01-28 LAB — CBC
HCT: 40.9 % (ref 36.0–46.0)
Hemoglobin: 13.5 g/dL (ref 12.0–15.0)
MCH: 30 pg (ref 26.0–34.0)
MCHC: 33 g/dL (ref 30.0–36.0)
MCV: 90.9 fL (ref 80.0–100.0)
Platelets: 254 10*3/uL (ref 150–400)
RBC: 4.5 MIL/uL (ref 3.87–5.11)
RDW: 15.9 % — ABNORMAL HIGH (ref 11.5–15.5)
WBC: 16.7 10*3/uL — ABNORMAL HIGH (ref 4.0–10.5)
nRBC: 0 % (ref 0.0–0.2)

## 2023-01-28 LAB — GLUCOSE, CAPILLARY
Glucose-Capillary: 119 mg/dL — ABNORMAL HIGH (ref 70–99)
Glucose-Capillary: 212 mg/dL — ABNORMAL HIGH (ref 70–99)
Glucose-Capillary: 204 mg/dL — ABNORMAL HIGH (ref 70–99)

## 2023-01-28 SURGERY — CLOSURE, COLOSTOMY, ROBOT-ASSISTED
Anesthesia: General

## 2023-01-28 MED ORDER — LACTATED RINGERS IV SOLN: INTRAVENOUS | Status: DC | PRN

## 2023-01-28 MED ORDER — AMISULPRIDE (ANTIEMETIC) 5 MG/2ML IV SOLN: 10.0000 mg | Freq: Once | INTRAVENOUS | Status: DC | PRN

## 2023-01-28 MED ORDER — LACTATED RINGERS IV SOLN: INTRAVENOUS | Status: DC

## 2023-01-28 MED ORDER — PROMETHAZINE HCL 25 MG/ML IJ SOLN: 6.2500 mg | INTRAMUSCULAR | Status: DC | PRN

## 2023-01-28 MED ORDER — PHENYLEPHRINE 80 MCG/ML (10ML) SYRINGE FOR IV PUSH (FOR BLOOD PRESSURE SUPPORT)
PREFILLED_SYRINGE | INTRAVENOUS | Status: DC | PRN
  Administered 2023-01-28 (×2): 160 ug via INTRAVENOUS

## 2023-01-28 MED ORDER — PROPOFOL 10 MG/ML IV BOLUS
INTRAVENOUS | Status: DC | PRN
  Administered 2023-01-28: 150 mg via INTRAVENOUS

## 2023-01-28 MED ORDER — ACETAMINOPHEN 10 MG/ML IV SOLN
INTRAVENOUS | Status: AC
  Filled 2023-01-28: qty 100

## 2023-01-28 MED ORDER — 0.9 % SODIUM CHLORIDE (POUR BTL) OPTIME
TOPICAL | Status: DC | PRN
  Administered 2023-01-28: 2000 mL

## 2023-01-28 MED ORDER — ACETAMINOPHEN 325 MG PO TABS
650.0000 mg | ORAL_TABLET | Freq: Four times a day (QID) | ORAL | Status: DC
  Administered 2023-01-28 – 2023-02-01 (×14): 650 mg via ORAL
  Filled 2023-01-28 (×15): qty 2

## 2023-01-28 MED ORDER — PHENYLEPHRINE 80 MCG/ML (10ML) SYRINGE FOR IV PUSH (FOR BLOOD PRESSURE SUPPORT)
PREFILLED_SYRINGE | INTRAVENOUS | Status: AC
  Filled 2023-01-28: qty 10

## 2023-01-28 MED ORDER — ONDANSETRON HCL 4 MG/2ML IJ SOLN: 4.0000 mg | Freq: Four times a day (QID) | INTRAMUSCULAR | Status: DC | PRN

## 2023-01-28 MED ORDER — ORAL CARE MOUTH RINSE: 15.0000 mL | Freq: Once | OROMUCOSAL | Status: AC

## 2023-01-28 MED ORDER — HYDROMORPHONE HCL 1 MG/ML IJ SOLN
0.2500 mg | INTRAMUSCULAR | Status: DC | PRN
  Administered 2023-01-28: 0.5 mg via INTRAVENOUS

## 2023-01-28 MED ORDER — CHLORHEXIDINE GLUCONATE CLOTH 2 % EX PADS: 6.0000 | MEDICATED_PAD | Freq: Once | CUTANEOUS | Status: DC

## 2023-01-28 MED ORDER — OXYCODONE HCL 5 MG PO TABS
5.0000 mg | ORAL_TABLET | ORAL | Status: DC | PRN
  Administered 2023-01-29: 5 mg via ORAL
  Filled 2023-01-28 (×3): qty 1

## 2023-01-28 MED ORDER — ENOXAPARIN SODIUM 40 MG/0.4ML IJ SOSY
40.0000 mg | PREFILLED_SYRINGE | INTRAMUSCULAR | Status: DC
  Administered 2023-01-29 – 2023-02-01 (×4): 40 mg via SUBCUTANEOUS
  Filled 2023-01-28 (×4): qty 0.4

## 2023-01-28 MED ORDER — BUPIVACAINE LIPOSOME 1.3 % IJ SUSP
INTRAMUSCULAR | Status: DC | PRN
  Administered 2023-01-28: 20 mL

## 2023-01-28 MED ORDER — MIRTAZAPINE 15 MG PO TABS
15.0000 mg | ORAL_TABLET | Freq: Every day | ORAL | Status: DC
  Administered 2023-01-28 – 2023-01-31 (×4): 15 mg via ORAL
  Filled 2023-01-28 (×4): qty 1

## 2023-01-28 MED ORDER — SIMVASTATIN 20 MG PO TABS
10.0000 mg | ORAL_TABLET | Freq: Every day | ORAL | Status: DC
  Administered 2023-01-28 – 2023-01-31 (×4): 10 mg via ORAL
  Filled 2023-01-28 (×4): qty 1

## 2023-01-28 MED ORDER — ACETAMINOPHEN 10 MG/ML IV SOLN
INTRAVENOUS | Status: DC | PRN
  Administered 2023-01-28: 1000 mg via INTRAVENOUS

## 2023-01-28 MED ORDER — FENTANYL CITRATE (PF) 100 MCG/2ML IJ SOLN
INTRAMUSCULAR | Status: AC
  Filled 2023-01-28: qty 2

## 2023-01-28 MED ORDER — ROCURONIUM BROMIDE 100 MG/10ML IV SOLN
INTRAVENOUS | Status: DC | PRN
  Administered 2023-01-28: 10 mg via INTRAVENOUS
  Administered 2023-01-28: 30 mg via INTRAVENOUS
  Administered 2023-01-28: 10 mg via INTRAVENOUS
  Administered 2023-01-28: 20 mg via INTRAVENOUS
  Administered 2023-01-28: 60 mg via INTRAVENOUS

## 2023-01-28 MED ORDER — METHOCARBAMOL 1000 MG/10ML IJ SOLN
500.0000 mg | Freq: Four times a day (QID) | INTRAVENOUS | Status: DC | PRN
  Filled 2023-01-28: qty 5

## 2023-01-28 MED ORDER — CHLORHEXIDINE GLUCONATE 0.12 % MT SOLN
15.0000 mL | Freq: Once | OROMUCOSAL | Status: AC
  Administered 2023-01-28: 15 mL via OROMUCOSAL

## 2023-01-28 MED ORDER — ONDANSETRON HCL 4 MG/2ML IJ SOLN
INTRAMUSCULAR | Status: AC
  Filled 2023-01-28: qty 2

## 2023-01-28 MED ORDER — ENSURE SURGERY PO LIQD
237.0000 mL | Freq: Two times a day (BID) | ORAL | Status: DC
  Administered 2023-01-29 – 2023-02-01 (×7): 237 mL via ORAL

## 2023-01-28 MED ORDER — SODIUM CHLORIDE 0.9 % IV SOLN
2.0000 g | INTRAVENOUS | Status: AC
  Administered 2023-01-28: 2 g via INTRAVENOUS
  Filled 2023-01-28: qty 2

## 2023-01-28 MED ORDER — ONDANSETRON HCL 4 MG/2ML IJ SOLN
INTRAMUSCULAR | Status: DC | PRN
  Administered 2023-01-28: 4 mg via INTRAVENOUS

## 2023-01-28 MED ORDER — SIMETHICONE 80 MG PO CHEW: 80.0000 mg | CHEWABLE_TABLET | Freq: Four times a day (QID) | ORAL | Status: DC | PRN

## 2023-01-28 MED ORDER — ENSURE PRE-SURGERY PO LIQD: 296.0000 mL | Freq: Once | ORAL | Status: DC

## 2023-01-28 MED ORDER — OXYCODONE HCL 5 MG PO TABS
10.0000 mg | ORAL_TABLET | ORAL | Status: DC | PRN
  Administered 2023-01-29: 10 mg via ORAL
  Filled 2023-01-28 (×2): qty 2

## 2023-01-28 MED ORDER — ALVIMOPAN 12 MG PO CAPS
12.0000 mg | ORAL_CAPSULE | ORAL | Status: AC
  Administered 2023-01-28: 12 mg via ORAL
  Filled 2023-01-28: qty 1

## 2023-01-28 MED ORDER — BUPIVACAINE-EPINEPHRINE (PF) 0.25% -1:200000 IJ SOLN
INTRAMUSCULAR | Status: AC
  Filled 2023-01-28: qty 30

## 2023-01-28 MED ORDER — LEVOTHYROXINE SODIUM 100 MCG PO TABS
100.0000 ug | ORAL_TABLET | Freq: Every day | ORAL | Status: DC
  Administered 2023-01-29 – 2023-02-01 (×4): 100 ug via ORAL
  Filled 2023-01-28 (×4): qty 1

## 2023-01-28 MED ORDER — MEPERIDINE HCL 50 MG/ML IJ SOLN: 6.2500 mg | INTRAMUSCULAR | Status: DC | PRN

## 2023-01-28 MED ORDER — LACTATED RINGERS IR SOLN
Status: DC | PRN
  Administered 2023-01-28: 1000 mL

## 2023-01-28 MED ORDER — DEXAMETHASONE SODIUM PHOSPHATE 10 MG/ML IJ SOLN
INTRAMUSCULAR | Status: DC | PRN
  Administered 2023-01-28: 10 mg via INTRAVENOUS

## 2023-01-28 MED ORDER — INSULIN ASPART 100 UNIT/ML IJ SOLN: 5.0000 [IU] | Freq: Once | INTRAMUSCULAR | Status: AC

## 2023-01-28 MED ORDER — ASPIRIN 81 MG PO TBEC
81.0000 mg | DELAYED_RELEASE_TABLET | Freq: Every day | ORAL | Status: DC
  Administered 2023-01-28 – 2023-01-31 (×4): 81 mg via ORAL
  Filled 2023-01-28 (×4): qty 1

## 2023-01-28 MED ORDER — INSULIN ASPART 100 UNIT/ML IJ SOLN
INTRAMUSCULAR | Status: AC
  Administered 2023-01-28: 5 [IU] via SUBCUTANEOUS
  Filled 2023-01-28: qty 1

## 2023-01-28 MED ORDER — DEXAMETHASONE SODIUM PHOSPHATE 10 MG/ML IJ SOLN
INTRAMUSCULAR | Status: AC
  Filled 2023-01-28: qty 1

## 2023-01-28 MED ORDER — FENTANYL CITRATE (PF) 100 MCG/2ML IJ SOLN
INTRAMUSCULAR | Status: DC | PRN
  Administered 2023-01-28 (×4): 50 ug via INTRAVENOUS
  Administered 2023-01-28: 100 ug via INTRAVENOUS

## 2023-01-28 MED ORDER — BUPIVACAINE-EPINEPHRINE (PF) 0.25% -1:200000 IJ SOLN
INTRAMUSCULAR | Status: DC | PRN
  Administered 2023-01-28: 30 mL

## 2023-01-28 MED ORDER — BUPIVACAINE LIPOSOME 1.3 % IJ SUSP
INTRAMUSCULAR | Status: AC
  Filled 2023-01-28: qty 20

## 2023-01-28 MED ORDER — PROPOFOL 10 MG/ML IV BOLUS
INTRAVENOUS | Status: AC
  Filled 2023-01-28: qty 20

## 2023-01-28 MED ORDER — ENSURE PRE-SURGERY PO LIQD: 592.0000 mL | Freq: Once | ORAL | Status: DC

## 2023-01-28 MED ORDER — HYDROMORPHONE HCL 1 MG/ML IJ SOLN
0.5000 mg | INTRAMUSCULAR | Status: DC | PRN
  Administered 2023-01-28: 0.5 mg via INTRAVENOUS
  Filled 2023-01-28: qty 0.5

## 2023-01-28 MED ORDER — SODIUM CHLORIDE 0.9 % IV SOLN: INTRAVENOUS | Status: DC

## 2023-01-28 MED ORDER — ENOXAPARIN SODIUM 40 MG/0.4ML IJ SOSY
40.0000 mg | PREFILLED_SYRINGE | Freq: Once | INTRAMUSCULAR | Status: AC
  Administered 2023-01-28: 40 mg via SUBCUTANEOUS
  Filled 2023-01-28: qty 0.4

## 2023-01-28 MED ORDER — PROCHLORPERAZINE EDISYLATE 10 MG/2ML IJ SOLN: 10.0000 mg | INTRAMUSCULAR | Status: DC | PRN

## 2023-01-28 MED ORDER — GABAPENTIN 300 MG PO CAPS
300.0000 mg | ORAL_CAPSULE | Freq: Three times a day (TID) | ORAL | Status: DC
  Administered 2023-01-28 – 2023-02-01 (×11): 300 mg via ORAL
  Filled 2023-01-28 (×11): qty 1

## 2023-01-28 MED ORDER — SUGAMMADEX SODIUM 200 MG/2ML IV SOLN
INTRAVENOUS | Status: DC | PRN
  Administered 2023-01-28: 200 mg via INTRAVENOUS

## 2023-01-28 MED ORDER — HYDROMORPHONE HCL 1 MG/ML IJ SOLN
INTRAMUSCULAR | Status: AC
  Administered 2023-01-28: 0.5 mg via INTRAVENOUS
  Filled 2023-01-28: qty 1

## 2023-01-28 SURGICAL SUPPLY — 123 items
ADH SKN CLS APL DERMABOND .7 (GAUZE/BANDAGES/DRESSINGS) ×1
APL PRP STRL LF DISP 70% ISPRP (MISCELLANEOUS)
APPLIER CLIP 5 13 M/L LIGAMAX5 (MISCELLANEOUS)
APPLIER CLIP ROT 10 11.4 M/L (STAPLE)
APR CLP MED LRG 11.4X10 (STAPLE)
APR CLP MED LRG 5 ANG JAW (MISCELLANEOUS)
BAG COUNTER SPONGE SURGICOUNT (BAG) ×1 IMPLANT
BAG SPNG CNTER NS LX DISP (BAG) ×1
BLADE EXTENDED COATED 6.5IN (ELECTRODE) IMPLANT
CANNULA REDUC XI 12-8 STAPL (CANNULA)
CANNULA REDUCER 12-8 DVNC XI (CANNULA) IMPLANT
CELLS DAT CNTRL 66122 CELL SVR (MISCELLANEOUS) IMPLANT
CHLORAPREP W/TINT 26 (MISCELLANEOUS) IMPLANT
CLIP APPLIE 5 13 M/L LIGAMAX5 (MISCELLANEOUS) IMPLANT
CLIP APPLIE ROT 10 11.4 M/L (STAPLE) IMPLANT
COVER SURGICAL LIGHT HANDLE (MISCELLANEOUS) ×2 IMPLANT
COVER TIP SHEARS 8 DVNC (MISCELLANEOUS) ×1 IMPLANT
COVER TIP SHEARS 8MM DA VINCI (MISCELLANEOUS) ×1
DEFOGGER SCOPE WARMER CLEARIFY (MISCELLANEOUS) IMPLANT
DERMABOND ADVANCED .7 DNX12 (GAUZE/BANDAGES/DRESSINGS) IMPLANT
DEVICE TROCAR PUNCTURE CLOSURE (ENDOMECHANICALS) IMPLANT
DRAIN CHANNEL 19F RND (DRAIN) IMPLANT
DRAPE ARM DVNC X/XI (DISPOSABLE) ×4 IMPLANT
DRAPE COLUMN DVNC XI (DISPOSABLE) ×1 IMPLANT
DRAPE DA VINCI XI ARM (DISPOSABLE) ×4
DRAPE DA VINCI XI COLUMN (DISPOSABLE) ×1
DRAPE SURG IRRIG POUCH 19X23 (DRAPES) ×1 IMPLANT
DRSG OPSITE POSTOP 4X10 (GAUZE/BANDAGES/DRESSINGS) IMPLANT
DRSG OPSITE POSTOP 4X6 (GAUZE/BANDAGES/DRESSINGS) IMPLANT
DRSG OPSITE POSTOP 4X8 (GAUZE/BANDAGES/DRESSINGS) IMPLANT
DRSG TEGADERM 2-3/8X2-3/4 SM (GAUZE/BANDAGES/DRESSINGS) ×5 IMPLANT
DRSG TEGADERM 4X4.75 (GAUZE/BANDAGES/DRESSINGS) IMPLANT
ELECT PENCIL ROCKER SW 15FT (MISCELLANEOUS) ×1 IMPLANT
ELECT REM PT RETURN 15FT ADLT (MISCELLANEOUS) ×1 IMPLANT
ENDOLOOP SUT PDS II  0 18 (SUTURE)
ENDOLOOP SUT PDS II 0 18 (SUTURE) IMPLANT
EVACUATOR SILICONE 100CC (DRAIN) IMPLANT
GAUZE SPONGE 2X2 STRL 8-PLY (GAUZE/BANDAGES/DRESSINGS) ×1 IMPLANT
GAUZE SPONGE 4X4 12PLY STRL (GAUZE/BANDAGES/DRESSINGS) IMPLANT
GLOVE ECLIPSE 8.0 STRL XLNG CF (GLOVE) ×3 IMPLANT
GLOVE INDICATOR 8.0 STRL GRN (GLOVE) ×3 IMPLANT
GOWN SRG XL LVL 4 BRTHBL STRL (GOWNS) ×1 IMPLANT
GOWN STRL NON-REIN XL LVL4 (GOWNS) ×1
GOWN STRL REUS W/ TWL XL LVL3 (GOWN DISPOSABLE) ×4 IMPLANT
GOWN STRL REUS W/TWL XL LVL3 (GOWN DISPOSABLE) ×4
GRASPER SUT TROCAR 14GX15 (MISCELLANEOUS) IMPLANT
HOLDER FOLEY CATH W/STRAP (MISCELLANEOUS) ×1 IMPLANT
IRRIG SUCT STRYKERFLOW 2 WTIP (MISCELLANEOUS) ×1
IRRIGATION SUCT STRKRFLW 2 WTP (MISCELLANEOUS) ×1 IMPLANT
KIT PROCEDURE DA VINCI SI (MISCELLANEOUS)
KIT PROCEDURE DVNC SI (MISCELLANEOUS) IMPLANT
KIT SIGMOIDOSCOPE (SET/KITS/TRAYS/PACK) IMPLANT
KIT TURNOVER KIT A (KITS) IMPLANT
NDL INSUFFLATION 14GA 120MM (NEEDLE) ×1 IMPLANT
NEEDLE INSUFFLATION 14GA 120MM (NEEDLE) ×1 IMPLANT
PACK CARDIOVASCULAR III (CUSTOM PROCEDURE TRAY) ×1 IMPLANT
PACK COLON (CUSTOM PROCEDURE TRAY) ×1 IMPLANT
PAD POSITIONING PINK XL (MISCELLANEOUS) ×1 IMPLANT
PROTECTOR NERVE ULNAR (MISCELLANEOUS) ×2 IMPLANT
RELOAD STAPLE 45 3.5 BLU DVNC (STAPLE) IMPLANT
RELOAD STAPLE 45 4.3 GRN DVNC (STAPLE) IMPLANT
RELOAD STAPLE 60 3.5 BLU DVNC (STAPLE) IMPLANT
RELOAD STAPLE 60 4.3 GRN DVNC (STAPLE) IMPLANT
RELOAD STAPLER 3.5X45 BLU DVNC (STAPLE) IMPLANT
RELOAD STAPLER 3.5X60 BLU DVNC (STAPLE) IMPLANT
RELOAD STAPLER 4.3X45 GRN DVNC (STAPLE) IMPLANT
RELOAD STAPLER 4.3X60 GRN DVNC (STAPLE) IMPLANT
RETRACTOR WND ALEXIS 18 MED (MISCELLANEOUS) IMPLANT
RTRCTR WOUND ALEXIS 18CM MED (MISCELLANEOUS)
SCISSORS LAP 5X35 DISP (ENDOMECHANICALS) ×1 IMPLANT
SEAL CANN UNIV 5-8 DVNC XI (MISCELLANEOUS) ×3 IMPLANT
SEAL XI 5MM-8MM UNIVERSAL (MISCELLANEOUS) ×3
SEALER VESSEL DA VINCI XI (MISCELLANEOUS) ×1
SEALER VESSEL EXT DVNC XI (MISCELLANEOUS) ×1 IMPLANT
SOL ELECTROSURG ANTI STICK (MISCELLANEOUS) ×1
SOLUTION ELECTROSURG ANTI STCK (MISCELLANEOUS) ×1 IMPLANT
SPIKE FLUID TRANSFER (MISCELLANEOUS) ×1 IMPLANT
STAPLER 45 DA VINCI SURE FORM (STAPLE)
STAPLER 45 SUREFORM DVNC (STAPLE) IMPLANT
STAPLER 60 DA VINCI SURE FORM (STAPLE)
STAPLER 60 SUREFORM DVNC (STAPLE) IMPLANT
STAPLER CANNULA SEAL DVNC XI (STAPLE) ×1 IMPLANT
STAPLER CANNULA SEAL XI (STAPLE) ×1
STAPLER ECHELON POWER CIR 29 (STAPLE) IMPLANT
STAPLER ECHELON POWER CIR 31 (STAPLE) IMPLANT
STAPLER RELOAD 3.5X45 BLU DVNC (STAPLE)
STAPLER RELOAD 3.5X45 BLUE (STAPLE)
STAPLER RELOAD 3.5X60 BLU DVNC (STAPLE)
STAPLER RELOAD 3.5X60 BLUE (STAPLE)
STAPLER RELOAD 4.3X45 GREEN (STAPLE)
STAPLER RELOAD 4.3X45 GRN DVNC (STAPLE)
STAPLER RELOAD 4.3X60 GREEN (STAPLE)
STAPLER RELOAD 4.3X60 GRN DVNC (STAPLE)
STOPCOCK 4 WAY LG BORE MALE ST (IV SETS) ×2 IMPLANT
SURGILUBE 2OZ TUBE FLIPTOP (MISCELLANEOUS) IMPLANT
SUT MNCRL AB 4-0 PS2 18 (SUTURE) ×1 IMPLANT
SUT PDS AB 1 CT1 27 (SUTURE) ×2 IMPLANT
SUT PROLENE 0 CT 2 (SUTURE) IMPLANT
SUT PROLENE 2 0 KS (SUTURE) IMPLANT
SUT PROLENE 2 0 SH DA (SUTURE) IMPLANT
SUT SILK 2 0 (SUTURE)
SUT SILK 2 0 SH CR/8 (SUTURE) IMPLANT
SUT SILK 2-0 18XBRD TIE 12 (SUTURE) IMPLANT
SUT SILK 3 0 (SUTURE)
SUT SILK 3 0 SH CR/8 (SUTURE) ×1 IMPLANT
SUT SILK 3-0 18XBRD TIE 12 (SUTURE) IMPLANT
SUT V-LOC BARB 180 2/0GR6 GS22 (SUTURE)
SUT VIC AB 3-0 SH 18 (SUTURE) IMPLANT
SUT VIC AB 3-0 SH 27 (SUTURE)
SUT VIC AB 3-0 SH 27XBRD (SUTURE) IMPLANT
SUT VICRYL 0 UR6 27IN ABS (SUTURE) ×1 IMPLANT
SUTURE V-LC BRB 180 2/0GR6GS22 (SUTURE) IMPLANT
SYR 20ML ECCENTRIC (SYRINGE) ×1 IMPLANT
SYS LAPSCP GELPORT 120MM (MISCELLANEOUS)
SYS WOUND ALEXIS 18CM MED (MISCELLANEOUS) ×1
SYSTEM LAPSCP GELPORT 120MM (MISCELLANEOUS) IMPLANT
SYSTEM WOUND ALEXIS 18CM MED (MISCELLANEOUS) ×1 IMPLANT
TAPE CLOTH SURG 6X10 WHT LF (GAUZE/BANDAGES/DRESSINGS) IMPLANT
TOWEL OR NON WOVEN STRL DISP B (DISPOSABLE) ×1 IMPLANT
TRAY FOLEY MTR SLVR 16FR STAT (SET/KITS/TRAYS/PACK) ×1 IMPLANT
TROCAR ADV FIXATION 5X100MM (TROCAR) ×1 IMPLANT
TUBING CONNECTING 10 (TUBING) ×2 IMPLANT
TUBING INSUFFLATION 10FT LAP (TUBING) ×1 IMPLANT

## 2023-01-28 NOTE — Transfer of Care (Signed)
Immediate Anesthesia Transfer of Care Note  Patient: Heather Vang  Procedure(s) Performed: ROBOTIC [place]  Patient Location: PACU  Anesthesia Type:General  Level of Consciousness: drowsy and patient cooperative  Airway & Oxygen Therapy: Patient Spontanous Breathing and Patient connected to face mask oxygen  Post-op Assessment: Report given to RN and Post -op Vital signs reviewed and stable  Post vital signs: Reviewed and stable  Last Vitals:  Vitals Value Taken Time  BP 103/71 01/28/23 1739  Temp    Pulse 84 01/28/23 1742  Resp 21 01/28/23 1742  SpO2 100 % 01/28/23 1742  Vitals shown include unvalidated device data.  Last Pain:  Vitals:   01/28/23 1158  TempSrc:   PainSc: 0-No pain      Patients Stated Pain Goal: 4 ([id number])  Complications: No notable events documented.

## 2023-01-28 NOTE — Op Note (Signed)
Vang: Heather Vang (03/18/1951, [id number])  Date of Surgery: 01/28/2023   Preoperative Diagnosis: PRESENCE OF OSTOMY   Postoperative Diagnosis: PRESENCE OF OSTOMY   Surgical Procedure: ROBOTIC HARTMANNS REVERSAL: 16606 (CPT)   Operative Team Members:  Surgeon(s) and Role:    * Hilde Churchman, Nickola Major, MD - Primary    * Leighton Ruff, MD - Assisting   Anesthesiologist: Nolon Nations, MD CRNA: [place] Stephanie C, CRNA; Montel Clock, CRNA   Anesthesia: General   Fluids:  Total I/O In: 1000 [I.V.:1000] Out: 300 [Urine:250; [id number]  Complications: None  Drains:  none   Specimen:  ID Type Source Tests Collected by Time Destination  1 : ostomy with donuts Tissue PATH GI Other SURGICAL PATHOLOGY Heather Vang, Nickola Major, MD 01/28/2023 1656      Disposition:  PACU - hemodynamically stable.  Plan of Care: Admit to inpatient     Indications for Procedure: Heather Vang is a 72 y.o. 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  I recommended robotic hartmann's reversal.  The procedure itself as well as its risks, benefits and alternatives were discussed.  The risks discussed included but were not limited to the risk of infection, bleeding, damage to nearby structures, and need for additional surgery or procedure postoperatively.  After a full discussion and all questions answered the Vang granted consent to proceed.  Findings: Dense adhesions from previous peritonitis   Description of Procedure:   On the date stated above the Vang was taken operating room suite and placed in lithotomy position after general endotracheal anesthesia was induced.  The Vang's abdomen was prepped and draped in usual sterile fashion.  I began by making a 5 mm incision under the right rib cage and inserting the 5 mm trocar to the abdomen insufflating the abdomen to 15 mmHg.  The abdomen was inspected.  There were dense adhesions  between the small intestine and the abdominal wall.  This initial trocar was upsized to an 8 mm robotic trocar.  2 additional trocars were placed on the right side of the abdomen both 8 mm robotic trocars.   Later in the case a 71m trocar was also placed in the right abdomen to aid with retraction.  I performed a sharp lysis of adhesions with laparoscopic scissors to free the small bowel from the abdominal wall.  I did this on the right side of the abdomen and then I proceeded continue to lyse adhesions with the robot.  The deBayrobotic system was docked and I continued to lyse adhesions.  I freed the entire abdominal wall adhesions to the small intestine and omentum.  I cleared the adhesions around the ostomy and then the ostomy was freed from the abdominal wall and open fashion.  I circumferentially divided the mucocutaneous junction, dissected the ostomy free of the subcutaneous attachments, and dissected the ostomy free of its adhesions to the abdominal wall fascia.  The ostomy was then delivered through the abdominal wall.  A manual pursestring device was used to place a pursestring Prolene suture around the healthy colon just proximal to the ostomy.  The bowel was then divided just distal to this pursestring and a 29 mm anvil of the circular end and stapler was secured in place using the Prolene suture.  The bowel was then returned to the abdomen and a wound protector was placed over the ostomy site so that we could reinsufflate the abdomen to 15 mmHg and continue working robotically.  At this point continue to lyse adhesions to free the transverse colon from its attachments to the omentum and abdominal wall superiorly and to free the small intestine from its attachments to the pelvis and in the left abdomen to allow a tension-free course for the transverse colon to reach down to the pelvis for reanastomosis.  The rectum was freed from attachments as well.  The sizers were then brought up through the  anus through the rectum into position and the 29 mm end-to-end stapler was inserted through the rectum.  There was a small serosal tear which was in the anterior portion of the rectum just distal to the previous rectal staple line.  This was used as our site of anastomosis in order to incorporate the serosal tear into the donut of the anastomosis.  The spike was deployed in the anvil connected the spike and the stapler was fired.  The stapler was removed and donuts were inspected and appeared complete.  There were passed off the field as a specimen along with the resected ostomy.  The rigid proctoscope was inserted past the anal sphincters and air was insufflated into the rectum.  The staple line was submerged in saline in the pelvis and there was no leakage or bubbling from the anastomosis with the rectum pressurized.  Air did travel up into the transverse colon.  With the anastomosis completely direct her attention to closure.  We undocked the robot and the ostomy site was closed in multiple layers.  I freed up the posterior rectus sheath and closed this with running 0 Vicryl suture.  This was run transversely.  I then closed the anterior rectus sheath vertically using #1 PDS suture.  A 2-0 PDS suture was used to run as a pursestring through the deep dermal layer of the ostomy site and tied down to pull the wound together but not completely closed.  A gauze wick was then inserted into the site and a dressing applied over this.  The trocars were all removed and the 4 port sites in the right abdomen were closed at the skin level using 4-0 Monocryl and Dermabond.  All sponge needle counts were correct at the end of the case.  At the end of the case we reviewed the infection status of the case. Vang: Heather Vang Elective Case Case: Elective Infection Present At Time Of Surgery (PATOS):  Some contamination from creating an anastomosis in the pelvis as well as from taking down the colostomy at the skin and  subcutaneous layers  Louanna Raw, MD General, Bariatric, & Minimally Invasive Surgery [facility], [place]

## 2023-01-28 NOTE — Anesthesia Procedure Notes (Signed)
Procedure Name: Intubation Date/Time: 01/28/2023 1:31 PM  Performed by: [place] Manus Rudd, CRNAPre-anesthesia Checklist: Patient identified, Emergency Drugs available, Suction available and Patient being monitored Patient Re-evaluated:Patient Re-evaluated prior to induction Oxygen Delivery Method: Circle system utilized Preoxygenation: Pre-oxygenation with 100% oxygen Induction Type: IV induction Ventilation: Mask ventilation without difficulty Laryngoscope Size: Mac and 3 Grade View: Grade I Tube type: Oral Tube size: 7.0 mm Number of attempts: 1 Airway Equipment and Method: Stylet and Oral airway Placement Confirmation: ETT inserted through vocal cords under direct vision, positive ETCO2 and breath sounds checked- equal and bilateral Secured at: 20 cm Tube secured with: Tape Dental Injury: Teeth and Oropharynx as per pre-operative assessment

## 2023-01-28 NOTE — Anesthesia Postprocedure Evaluation (Signed)
Anesthesia Post Note  Patient: Heather Vang  Procedure(s) Performed: ROBOTIC [place]     Patient location during evaluation: PACU Anesthesia Type: General Level of consciousness: sedated and patient cooperative Pain management: pain level controlled Vital Signs Assessment: post-procedure vital signs reviewed and stable Respiratory status: spontaneous breathing Cardiovascular status: stable Anesthetic complications: no   No notable events documented.  Last Vitals:  Vitals:   01/28/23 1830 01/28/23 1856  BP: (!) 118/52 (!) 118/56  Pulse: 76 76  Resp: 10 16  Temp:  36.7 C  SpO2: 99% 100%    Last Pain:  Vitals:   01/28/23 1856  TempSrc: Axillary  PainSc:                  Nolon Nations

## 2023-01-28 NOTE — H&P (Signed)
Admitting Physician: Nickola Major Breshae Belcher  Service: General Surgery  CC: Presence of ostomy  Subjective   HPI: Heather Vang is an 72 y.o. female who is here for ostomy takedown  Past Medical History:  Diagnosis Date   Anxiety    Diabetes mellitus without complication ([place])    Hypertension    Hypothyroidism    Thyroid disease     Past Surgical History:  Procedure Laterality Date   COSMETIC SURGERY     stomach    GALLBLADDER SURGERY  03/07/2014   12/18/22- pt denies ever having gall bladder surgery   LAPAROSCOPIC SMALL BOWEL RESECTION N/A 07/07/2022   Procedure: DIAGNOSTIC LAPAROSCOPY CONVERTED TO OPEN HARTMANN'S PROCEDURE;  Surgeon: Felicie Morn, MD;  Location: [facility] ORS;  Service: General;  Laterality: N/A;   SHOULDER SURGERY  06/06/2014    Family History  Problem Relation Age of Onset   Diabetes Mother     Social:  reports that she has quit smoking. Her smoking use included cigarettes. She has never used smokeless tobacco. She reports that she does not drink alcohol and does not use drugs.  Allergies: No Known Allergies  Medications: Current Outpatient Medications  Medication Instructions   aspirin EC 81 mg, Oral, Daily at bedtime, Swallow whole.   EMPAGLIFLOZIN-METFORMIN HCL PO 1 tablet, Oral, 2 times daily, Jardianz Duo from [place]: Empagliflozin/metformin 12.33m/850mg   levothyroxine (SYNTHROID) 100 mcg, Oral, Daily before breakfast   mirtazapine (REMERON) 15 mg, Oral, Daily at bedtime   simvastatin (ZOCOR) 10 mg, Oral, Daily at bedtime    ROS - all of the below systems have been reviewed with the patient and positives are indicated with bold text General: chills, fever or night sweats Eyes: blurry vision or double vision ENT: epistaxis or sore throat Allergy/Immunology: itchy/watery eyes or nasal congestion Hematologic/Lymphatic: bleeding problems, blood clots or swollen lymph nodes Endocrine: temperature intolerance or unexpected weight  changes Breast: new or changing breast lumps or nipple discharge Resp: cough, shortness of breath, or wheezing CV: chest pain or dyspnea on exertion GI: as per HPI GU: dysuria, trouble voiding, or hematuria MSK: joint pain or joint stiffness Neuro: TIA or stroke symptoms Derm: pruritus and skin lesion changes Psych: anxiety and depression  Objective   PE Blood pressure (!) 146/65, pulse 76, temperature 97.8 F (36.6 C), temperature source Oral, resp. rate 15, height 5' 2"$  (1.575 m), weight 62.1 kg, SpO2 96 %. Constitutional: NAD; conversant; no deformities Eyes: Moist conjunctiva; no lid lag; anicteric; PERRL Neck: Trachea midline; no thyromegaly Lungs: Normal respiratory effort; no tactile fremitus CV: RRR; no palpable thrills; no pitting edema GI: Abd Ostomy in place left abodmn; no palpable hepatosplenomegaly MSK: Normal range of motion of extremities; no clubbing/cyanosis Psychiatric: Appropriate affect; alert and oriented x3 Lymphatic: No palpable cervical or axillary lymphadenopathy  Results for orders placed or performed during the hospital encounter of 01/28/23 (from the past 24 hour(s))  Glucose, capillary     Status: Abnormal   Collection Time: 01/28/23 11:31 AM  Result Value Ref Range   Glucose-Capillary 119 (H) 70 - 99 mg/dL    Imaging Orders  No imaging studies ordered today     Assessment and Plan   Heather Vang is an 72y.o. 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  I recommended robotic, possibly open, ostomy takedown.  We discussed the procedure, its risks, benefits and alternatives with the assistance of the spanish interpreter.  The patient granted consent to proceed.  We will proceed as scheduled.     Felicie Morn, MD  [facility] Use [email or web address] to contact on call provider

## 2023-01-28 NOTE — Anesthesia Preprocedure Evaluation (Signed)
Anesthesia Evaluation  Patient identified by MRN, date of birth, ID band Patient awake    Reviewed: Allergy & Precautions, NPO status , Patient's Chart, lab work & pertinent test results  Airway Mallampati: II  TM Distance: >3 FB Neck ROM: Full    Dental no notable dental hx. (+) Dental Advisory Given, Teeth Intact   Pulmonary neg pulmonary ROS, former smoker   Pulmonary exam normal breath sounds clear to auscultation       Cardiovascular hypertension, Normal cardiovascular exam Rhythm:Regular Rate:Normal     Neuro/Psych  PSYCHIATRIC DISORDERS Anxiety     negative neurological ROS     GI/Hepatic Neg liver ROS,,,Diverticulitis with perforation and abcess   Endo/Other  diabetesHypothyroidism    Renal/GU negative Renal ROS     Musculoskeletal negative musculoskeletal ROS (+)    Abdominal   Peds  Hematology negative hematology ROS (+)   Anesthesia Other Findings   Reproductive/Obstetrics negative OB ROS                              Anesthesia Physical Anesthesia Plan  ASA: 3  Anesthesia Plan: General   Post-op Pain Management: Ofirmev IV (intra-op)*   Induction: Intravenous and Rapid sequence  PONV Risk Score and Plan: 3 and Ondansetron, Dexamethasone and Treatment may vary due to age or medical condition  Airway Management Planned: Oral ETT  Additional Equipment:   Intra-op Plan:   Post-operative Plan: Extubation in OR  Informed Consent: I have reviewed the patients History and Physical, chart, labs and discussed the procedure including the risks, benefits and alternatives for the proposed anesthesia with the patient or authorized representative who has indicated his/her understanding and acceptance.     Dental advisory given  Plan Discussed with: CRNA  Anesthesia Plan Comments: (2 x PIV)         Anesthesia Quick Evaluation

## 2023-01-29 ENCOUNTER — Encounter (HOSPITAL_COMMUNITY): Payer: Self-pay | Admitting: Surgery

## 2023-01-29 LAB — CBC
HCT: 37.7 % (ref 36.0–46.0)
Hemoglobin: 12.5 g/dL (ref 12.0–15.0)
MCH: 30.4 pg (ref 26.0–34.0)
MCHC: 33.2 g/dL (ref 30.0–36.0)
MCV: 91.7 fL (ref 80.0–100.0)
Platelets: 251 10*3/uL (ref 150–400)
RBC: 4.11 MIL/uL (ref 3.87–5.11)
RDW: 16 % — ABNORMAL HIGH (ref 11.5–15.5)
WBC: 14.1 10*3/uL — ABNORMAL HIGH (ref 4.0–10.5)
nRBC: 0 % (ref 0.0–0.2)

## 2023-01-29 LAB — BASIC METABOLIC PANEL
Anion gap: 11 (ref 5–15)
BUN: 6 mg/dL — ABNORMAL LOW (ref 8–23)
CO2: 20 mmol/L — ABNORMAL LOW (ref 22–32)
Calcium: 8.5 mg/dL — ABNORMAL LOW (ref 8.9–10.3)
Chloride: 107 mmol/L (ref 98–111)
Creatinine, Ser: 0.5 mg/dL (ref 0.44–1.00)
GFR, Estimated: >60 mL/min (ref >60)
Glucose, Bld: 172 mg/dL — ABNORMAL HIGH (ref 70–99)
Potassium: 3.2 mmol/L — ABNORMAL LOW (ref 3.5–5.1)
Sodium: 138 mmol/L (ref 135–145)

## 2023-01-29 NOTE — TOC Progression Note (Signed)
Transition of Care [facility]) - Progression Note    Patient Details  Name: Heather Vang MRN: [id number] Date of Birth: Jun 17, 1951  Transition of Care [facility]) CM/SW Contact  Henrietta Dine, RN Phone Number: 01/29/2023, 3:39 PM  Clinical Narrative:    No PCP or insurance listed; pt requests the CM contact her POC, niece Ramon Dredge [phone]); LVM for niece; awaiting return call; unable to complete TOC assessment.       Expected Discharge Plan and Services                                               Social Determinants of Health (SDOH) Interventions SDOH Screenings   Tobacco Use: Medium Risk (01/29/2023)    Readmission Risk Interventions     No data to display

## 2023-01-29 NOTE — Progress Notes (Addendum)
Progress Note: General Surgery Service   Chief Complaint/Subjective: No flatus or bowel movements. Drinking some clear liquids Ambulated in hall last night (POD 0)  Objective: Vital signs in last 24 hours: Temp:  [97.6 F (36.4 C)-98.6 F (37 C)] 98.4 F (36.9 C) (02/23 0540) Pulse Rate:  [71-87] 87 (02/23 0540) Resp:  [8-18] 18 (02/23 0540) BP: (103-146)/(52-71) 105/52 (02/23 0540) SpO2:  [92 %-100 %] 93 % (02/23 0540) Weight:  [62.1 kg] 62.1 kg (02/22 1158) Last BM Date : 01/27/23  Intake/Output from previous day: 02/22 0701 - 02/23 0700 In: 2077.5 [P.O.:240; I.V.:1837.5] Out: 1110 [Urine:1060; Blood:50] Intake/Output this shift: No intake/output data recorded.  Constitutional: NAD; conversant; no deformities Eyes: Moist conjunctiva; no lid lag; anicteric; PERRL Neck: Trachea midline; no thyromegaly Lungs: Normal respiratory effort; no tactile fremitus CV: RRR; no palpable thrills; no pitting edema GI: Abd incisions c/d/I w/ glue, ostomy site bandaged; no palpable hepatosplenomegaly MSK: Normal range of motion of extremities; no clubbing/cyanosis Psychiatric: Appropriate affect; alert and oriented x3 Lymphatic: No palpable cervical or axillary lymphadenopathy  Lab Results: CBC  Recent Labs    01/28/23 1924 01/29/23 0429  WBC 16.7* 14.1*  HGB 13.5 12.5  HCT 40.9 37.7  PLT 254 251   BMET Recent Labs    01/28/23 1924 01/29/23 0429  NA  --  138  K  --  3.2*  CL  --  107  CO2  --  20*  GLUCOSE  --  172*  BUN  --  6*  CREATININE 0.66 0.50  CALCIUM  --  8.5*   PT/INR No results for input(s): "LABPROT", "INR" in the last 72 hours. ABG No results for input(s): "PHART", "HCO3" in the last 72 hours.  Invalid input(s): "PCO2", "PO2"  Anti-infectives: Anti-infectives (From admission, onward)    Start     Dose/Rate Route Frequency Ordered Stop   01/28/23 1130  cefoTEtan (CEFOTAN) 2 g in sodium chloride 0.9 % 100 mL IVPB        2 g 200 mL/hr over 30  Minutes Intravenous On call to O.R. 01/28/23 1122 01/28/23 1341       Medications: Scheduled Meds:  acetaminophen  650 mg Oral Q6H   aspirin EC  81 mg Oral QHS   enoxaparin (LOVENOX) injection  40 mg Subcutaneous Q24H   feeding supplement  237 mL Oral BID BM   gabapentin  300 mg Oral TID   levothyroxine  100 mcg Oral Q0600   mirtazapine  15 mg Oral QHS   simvastatin  10 mg Oral QHS   Continuous Infusions:  sodium chloride 75 mL/hr at 01/29/23 0604   methocarbamol (ROBAXIN) IV     PRN Meds:.HYDROmorphone (DILAUDID) injection, methocarbamol (ROBAXIN) IV, ondansetron (ZOFRAN) IV, oxyCODONE, oxyCODONE, prochlorperazine, simethicone  Assessment/Plan: Ms. Dysinger is a 72 year old female with a history of Hartmann's procedure for diverticular perforation on the emergency general surgery service, who is now s/p robotic Hartmann's reversal on 01/28/23.  Doing well postoperative day 1  Remove wick at ostomy site on Saturday 2/24, no need to repack wound, just cover with bandage Decrease IVF Remove foley catheter Full liquid diet, slowly advance as bowel function returns Ambulate in hallway 3x per day    LOS: 1 day   FEN: FLD ID: None VTE: SCDs, Lovenox Foley: Remove today Dispo: Continue care on floor till return of bowel function, anticipate discharge on Monday or Tuesday    Felicie Morn, MD  [facility] Use [email or web address] to  contact on call provider  Daily Billing: [phone] - post op

## 2023-01-30 LAB — CBC
HCT: 32.5 % — ABNORMAL LOW (ref 36.0–46.0)
Hemoglobin: 10.8 g/dL — ABNORMAL LOW (ref 12.0–15.0)
MCH: 30.5 pg (ref 26.0–34.0)
MCHC: 33.2 g/dL (ref 30.0–36.0)
MCV: 91.8 fL (ref 80.0–100.0)
Platelets: 204 10*3/uL (ref 150–400)
RBC: 3.54 MIL/uL — ABNORMAL LOW (ref 3.87–5.11)
RDW: 16.6 % — ABNORMAL HIGH (ref 11.5–15.5)
WBC: 10.7 10*3/uL — ABNORMAL HIGH (ref 4.0–10.5)
nRBC: 0 % (ref 0.0–0.2)

## 2023-01-30 LAB — BASIC METABOLIC PANEL
Anion gap: 7 (ref 5–15)
BUN: 7 mg/dL — ABNORMAL LOW (ref 8–23)
CO2: 24 mmol/L (ref 22–32)
Calcium: 8 mg/dL — ABNORMAL LOW (ref 8.9–10.3)
Chloride: 110 mmol/L (ref 98–111)
Creatinine, Ser: 0.54 mg/dL (ref 0.44–1.00)
GFR, Estimated: >60 mL/min (ref >60)
Glucose, Bld: 116 mg/dL — ABNORMAL HIGH (ref 70–99)
Potassium: 3 mmol/L — ABNORMAL LOW (ref 3.5–5.1)
Sodium: 141 mmol/L (ref 135–145)

## 2023-01-30 NOTE — Progress Notes (Signed)
2 Days Post-Op   Subjective/Chief Complaint: No complaints this morning Patient had a bowel movement Tolerating a full liquid diet   Objective: Vital signs in last 24 hours: Temp:  [97.9 F (36.6 C)-99.3 F (37.4 C)] 98.3 F (36.8 C) (02/24 0533) Pulse Rate:  [73-92] 86 (02/24 0533) Resp:  [14-18] 14 (02/24 0533) BP: (101-142)/(48-81) 142/56 (02/24 0533) SpO2:  [93 %-95 %] 95 % (02/24 0533) Weight:  [66.8 kg] 66.8 kg (02/24 0533) Last BM Date : 01/30/23  Intake/Output from previous day: 02/23 0701 - 02/24 0700 In: 1560 [P.O.:720; I.V.:840] Out: 1304 [Urine:1303; Stool:1] Intake/Output this shift: No intake/output data recorded.  Exam: She appears well.  She is awake and alert and following commands Her abdomen is soft with no distention.  There is minimal postoperative tenderness.  The ostomy site is stable  Lab Results:  Recent Labs    01/29/23 0429 01/30/23 0519  WBC 14.1* 10.7*  HGB 12.5 10.8*  HCT 37.7 32.5*  PLT 251 204   BMET Recent Labs    01/29/23 0429 01/30/23 0519  NA 138 141  K 3.2* 3.0*  CL 107 110  CO2 20* 24  GLUCOSE 172* 116*  BUN 6* 7*  CREATININE 0.50 0.54  CALCIUM 8.5* 8.0*   PT/INR No results for input(s): "LABPROT", "INR" in the last 72 hours. ABG No results for input(s): "PHART", "HCO3" in the last 72 hours.  Invalid input(s): "PCO2", "PO2"  Studies/Results: No results found.  Anti-infectives: Anti-infectives (From admission, onward)    Start     Dose/Rate Route Frequency Ordered Stop   01/28/23 1130  cefoTEtan (CEFOTAN) 2 g in sodium chloride 0.9 % 100 mL IVPB        2 g 200 mL/hr over 30 Minutes Intravenous On call to O.R. 01/28/23 1122 01/28/23 1341       Assessment/Plan: s/p Procedure(s): ROBOTIC HARTMANNS REVERSAL (N/A)  Postop day #2  Will advance to a soft diet Patient encouraged to ambulate Labs are stable    LOS: 2 days    Coralie Keens 01/30/2023

## 2023-01-30 NOTE — Progress Notes (Signed)
Mobility Specialist - Progress Note   01/30/23 0910  Mobility  Activity Ambulated with assistance in hallway  Level of Assistance Modified independent, requires aide device or extra time  Assistive Device Front wheel walker  Distance Ambulated (ft) 500 ft  Activity Response Tolerated well  Mobility Referral Yes  $Mobility charge 1 Mobility   Pt received in bed and agreeable to mobility. No complaints during session. Pt to bed after session with all needs met & nurse in room.   [facility]

## 2023-01-30 NOTE — Progress Notes (Signed)
Mobility Specialist - Progress Note   01/30/23 1409  Mobility  Activity Ambulated with assistance in hallway  Level of Assistance Standby assist, set-up cues, supervision of patient - no hands on  Assistive Device Front wheel walker  Distance Ambulated (ft) 500 ft  Activity Response Tolerated well  Mobility Referral Yes  $Mobility charge 1 Mobility   Pt received in bed and agreeable to mobility. No complaints during session. Pt to recliner after session with all needs met.    [facility]

## 2023-01-31 LAB — BASIC METABOLIC PANEL
Anion gap: 8 (ref 5–15)
BUN: 5 mg/dL — ABNORMAL LOW (ref 8–23)
CO2: 24 mmol/L (ref 22–32)
Calcium: 8.3 mg/dL — ABNORMAL LOW (ref 8.9–10.3)
Chloride: 108 mmol/L (ref 98–111)
Creatinine, Ser: 0.48 mg/dL (ref 0.44–1.00)
GFR, Estimated: >60 mL/min (ref >60)
Glucose, Bld: 115 mg/dL — ABNORMAL HIGH (ref 70–99)
Potassium: 2.9 mmol/L — ABNORMAL LOW (ref 3.5–5.1)
Sodium: 140 mmol/L (ref 135–145)

## 2023-01-31 LAB — CBC
HCT: 32.9 % — ABNORMAL LOW (ref 36.0–46.0)
Hemoglobin: 10.8 g/dL — ABNORMAL LOW (ref 12.0–15.0)
MCH: 30.1 pg (ref 26.0–34.0)
MCHC: 32.8 g/dL (ref 30.0–36.0)
MCV: 91.6 fL (ref 80.0–100.0)
Platelets: 236 10*3/uL (ref 150–400)
RBC: 3.59 MIL/uL — ABNORMAL LOW (ref 3.87–5.11)
RDW: 16.4 % — ABNORMAL HIGH (ref 11.5–15.5)
WBC: 9.7 10*3/uL (ref 4.0–10.5)
nRBC: 0 % (ref 0.0–0.2)

## 2023-01-31 NOTE — Progress Notes (Signed)
3 Days Post-Op   Subjective/Chief Complaint: Had several Bm's Tolerating fulls Pain well controlled   Objective: Vital signs in last 24 hours: Temp:  [98.1 F (36.7 C)-99.3 F (37.4 C)] 98.2 F (36.8 C) (02/25 0433) Pulse Rate:  [78-91] 78 (02/25 0433) Resp:  [14-16] 16 (02/25 0433) BP: (117-139)/(54-81) 134/68 (02/25 0433) SpO2:  [95 %-97 %] 95 % (02/25 0433) Weight:  [88 kg] 88 kg (02/25 0500) Last BM Date : 01/31/23  Intake/Output from previous day: 02/24 0701 - 02/25 0700 In: 69 [P.O.:220; I.V.:600] Out: 3 [Urine:3] Intake/Output this shift: No intake/output data recorded.  Exam; Awake and alert, up in a chair Abdomen soft, non-distended, wounds clean, wick removed from ostomy site  Lab Results:  Recent Labs    01/30/23 0519 01/31/23 0421  WBC 10.7* 9.7  HGB 10.8* 10.8*  HCT 32.5* 32.9*  PLT 204 236   BMET Recent Labs    01/30/23 0519 01/31/23 0421  NA 141 140  K 3.0* 2.9*  CL 110 108  CO2 24 24  GLUCOSE 116* 115*  BUN 7* 5*  CREATININE 0.54 0.48  CALCIUM 8.0* 8.3*   PT/INR No results for input(s): "LABPROT", "INR" in the last 72 hours. ABG No results for input(s): "PHART", "HCO3" in the last 72 hours.  Invalid input(s): "PCO2", "PO2"  Studies/Results: No results found.  Anti-infectives: Anti-infectives (From admission, onward)    Start     Dose/Rate Route Frequency Ordered Stop   01/28/23 1130  cefoTEtan (CEFOTAN) 2 g in sodium chloride 0.9 % 100 mL IVPB        2 g 200 mL/hr over 30 Minutes Intravenous On call to O.R. 01/28/23 1122 01/28/23 1341       Assessment/Plan: s/p Procedure(s): ROBOTIC HARTMANNS REVERSAL (N/A)  Doing well Will advance to soft diet Ok to shower Likely home tomorrow  LOS: 3 days    Coralie Keens MD 01/31/2023

## 2023-01-31 NOTE — TOC Progression Note (Signed)
Transition of Care [facility]) - Progression Note    Patient Details  Name: Heather Vang MRN: [id number] Date of Birth: Jan 27, 1951  Transition of Care [facility]) CM/SW Contact  Henrietta Dine, RN Phone Number: 01/31/2023, 5:44 PM  Clinical Narrative:    Pt/family not in room; unable to complete [facility] assessment        Expected Discharge Plan and Services                                               Social Determinants of Health (SDOH) Interventions SDOH Screenings   Tobacco Use: Medium Risk (01/29/2023)    Readmission Risk Interventions     No data to display

## 2023-02-01 LAB — CBC
HCT: 33.3 % — ABNORMAL LOW (ref 36.0–46.0)
Hemoglobin: 11.2 g/dL — ABNORMAL LOW (ref 12.0–15.0)
MCH: 30.2 pg (ref 26.0–34.0)
MCHC: 33.6 g/dL (ref 30.0–36.0)
MCV: 89.8 fL (ref 80.0–100.0)
Platelets: 291 10*3/uL (ref 150–400)
RBC: 3.71 MIL/uL — ABNORMAL LOW (ref 3.87–5.11)
RDW: 15.9 % — ABNORMAL HIGH (ref 11.5–15.5)
WBC: 9.5 10*3/uL (ref 4.0–10.5)
nRBC: 0 % (ref 0.0–0.2)

## 2023-02-01 LAB — BASIC METABOLIC PANEL
Anion gap: 9 (ref 5–15)
BUN: 6 mg/dL — ABNORMAL LOW (ref 8–23)
CO2: 23 mmol/L (ref 22–32)
Calcium: 8.6 mg/dL — ABNORMAL LOW (ref 8.9–10.3)
Chloride: 106 mmol/L (ref 98–111)
Creatinine, Ser: 0.48 mg/dL (ref 0.44–1.00)
GFR, Estimated: >60 mL/min (ref >60)
Glucose, Bld: 126 mg/dL — ABNORMAL HIGH (ref 70–99)
Potassium: 2.8 mmol/L — ABNORMAL LOW (ref 3.5–5.1)
Sodium: 138 mmol/L (ref 135–145)

## 2023-02-01 LAB — SURGICAL PATHOLOGY

## 2023-02-01 MED ORDER — METHOCARBAMOL 750 MG PO TABS: 750.0000 mg | ORAL_TABLET | Freq: Four times a day (QID) | ORAL | 0 refills | Status: AC

## 2023-02-01 MED ORDER — POTASSIUM CHLORIDE CRYS ER 20 MEQ PO TBCR
40.0000 meq | EXTENDED_RELEASE_TABLET | ORAL | Status: AC
  Administered 2023-02-01 (×3): 40 meq via ORAL
  Filled 2023-02-01 (×3): qty 2

## 2023-02-01 MED ORDER — OXYCODONE-ACETAMINOPHEN 5-325 MG PO TABS: 1.0000 | ORAL_TABLET | ORAL | 0 refills | Status: AC | PRN

## 2023-02-01 NOTE — Discharge Summary (Signed)
  Patient ID: Heather Vang [id number] 72 y.o. 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[place]) 123456   Sepsis ([place]) 07/02/2022   Hypertension    DM2 (diabetes mellitus, type 2) ([place])    Acquired hypothyroidism    Perforated diverticulum 07/01/2022   Diverticulitis of colon with perforation 07/01/2022   Left ankle sprain 06/03/2015     Discharge Diagnoses:  Patient Active Problem List   Diagnosis Date Noted   History of creation of ostomy ([place]) 01/28/2023   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  Recovered well  Consults: None  Significant Diagnostic Studies: None  Treatments: surgery: as above  Disposition: Home  Patient Instructions:  Allergies as of 02/01/2023   No Known Allergies      Medication List     TAKE these medications    aspirin EC 81 MG tablet Take 81 mg by mouth at bedtime. Swallow whole.   EMPAGLIFLOZIN-METFORMIN HCL PO Take 1 tablet by mouth in the morning and at bedtime. Lequita Asal from  [place]: Empagliflozin/metformin 12.61m/850mg   levothyroxine 100 MCG tablet Commonly known as: SYNTHROID Take 100 mcg by mouth daily before breakfast.   methocarbamol 750 MG tablet Commonly known as: Robaxin-750 Take 1 tablet (750 mg total) by mouth 4 (four) times daily.   mirtazapine 15 MG tablet Commonly known as: REMERON Take 15 mg by mouth at bedtime.   oxyCODONE-acetaminophen 5-325 MG tablet Commonly known as: Percocet Take 1 tablet by mouth every 4 (four) hours as needed for severe pain.   simvastatin 10 MG tablet Commonly known as: ZOCOR Take 10 mg by mouth at bedtime.        Activity: no heavy lifting for 4 weeks Diet: regular diet Wound Care: keep wound clean and dry  Follow-up:  With Dr. SThermon Leylandin 4 weeks.  Signed: PNickola MajorStechschulte [facility] [place]  02/01/2023, 7:38 AM

## 2023-02-01 NOTE — TOC Transition Note (Signed)
Transition of Care [facility]) - CM/SW Discharge Note   Patient Details  Name: Heather Vang MRN: [id number] Date of Birth: 25-Sep-1951  [facility]) CM/SW Contact:  Lennart Pall, LCSW Phone Number: 02/01/2023, 11:19 AM   Clinical Narrative:     Met with pt and niece to review dc needs.  Pt and niece report that pt will remain in [place] until April and with then return to [place].  She does travel to [facility] several times per year and pt is interested in establishing PCP here - able to secure a new patient appointment at [facility] on 02/04/23 @ 11:20 am.  Written information provided to pt and niece.  No further TOC needs.  Final next level of care: Home/Self Care Barriers to Discharge: No Barriers Identified   Patient Goals and CMS Choice      Discharge Placement                         Discharge Plan and Services Additional resources added to the After Visit Summary for                  DME Arranged: N/A DME Agency: NA                  Social Determinants of Health (SDOH) Interventions SDOH Screenings   Tobacco Use: Medium Risk (01/29/2023)     Readmission Risk Interventions    02/01/2023   11:18 AM  Readmission Risk Prevention Plan  Transportation Screening Complete  PCP or Specialist Appt within 5-7 Days Complete  Home Care Screening Complete  Medication Review (RN CM) Complete

## 2023-02-01 NOTE — Progress Notes (Signed)
Patient was given discharge instructions, and all questions were answered.  Patient was stable for discharge and was taken to the main exit by wheelchair. 

## 2023-02-01 NOTE — Discharge Instructions (Signed)
POST OP INSTRUCTIONS AFTER COLON SURGERY  DIET: Be sure to include lots of fluids daily to stay hydrated - 64oz of water per day (8, 8 oz glasses).  Avoid fast food or heavy meals for the first couple of weeks as your are more likely to get nauseated. Avoid raw/uncooked fruits or vegetables for the first 4 weeks (its ok to have these if they are blended into smoothie form). If you have fruits/vegetables, make sure they are cooked until soft enough to mash on the roof of your mouth and chew your food well. Otherwise, diet as tolerated.  Take your usually prescribed home medications unless otherwise directed.  PAIN CONTROL: Pain is best controlled by a usual combination of three different methods TOGETHER: Ice/Heat Over the counter pain medication Prescription pain medication Most patients will experience some swelling and bruising around the surgical site.  Ice packs or heating pads (30-60 minutes up to 6 times a day) will help. Some people prefer to use ice alone, heat alone, alternating between ice & heat.  Experiment to what works for you.  Swelling and bruising can take several weeks to resolve.   It is helpful to take an over-the-counter pain medication regularly for the first few weeks: Ibuprofen (Motrin/Advil) - 200mg tabs - take 3 tabs (600mg) every 6 hours as needed for pain (unless you have been directed previously to avoid NSAIDs/ibuprofen) Acetaminophen (Tylenol) - you may take 650mg every 6 hours as needed. You can take this with motrin as they act differently on the body. If you are taking a narcotic pain medication that has acetaminophen in it, do not take over the counter tylenol at the same time. NOTE: You may take both of these medications together - most patients  find it most helpful when alternating between the two (i.e. Ibuprofen at 6am, tylenol at 9am, ibuprofen at 12pm ...) A  prescription for pain medication should be given to you upon discharge.  Take your pain medication as  prescribed if your pain is not adequatly controlled with the over-the-counter pain reliefs mentioned above.  Avoid getting constipated.  Between the surgery and the pain medications, it is common to experience some constipation.  Increasing fluid intake and taking a fiber supplement (such as Metamucil, Citrucel, FiberCon, MiraLax, etc) 1-2 times a day regularly will usually help prevent this problem from occurring.  A mild laxative (prune juice, Milk of Magnesia, MiraLax, etc) should be taken according to package directions if there are no bowel movements after 48 hours.    Dressing: Your incisions are covered in Dermabond which is like sterile superglue for the skin. This will come off on it's own in a couple weeks. It is waterproof and you may bathe normally starting the day after your surgery in a shower. Avoid baths/pools/lakes/oceans until your wounds have fully healed.  ACTIVITIES as tolerated:   Avoid heavy lifting (>10lbs or 1 gallon of milk) for the next 6 weeks. You may resume regular daily activities as tolerated--such as daily self-care, walking, climbing stairs--gradually increasing activities as tolerated.  If you can walk 30 minutes without difficulty, it is safe to try more intense activity such as jogging, treadmill, bicycling, low-impact aerobics.  DO NOT PUSH THROUGH PAIN.  Let pain be your guide: If it hurts to do something, don't do it. You may drive when you are no longer taking prescription pain medication, you can comfortably wear a seatbelt, and you can safely maneuver your car and apply brakes.  FOLLOW UP in our   office Please call [facility] at [phone] to set up an appointment to see your surgeon in the office for a follow-up appointment approximately 2 weeks after your surgery. Make sure that you call for this appointment the day you arrive home to insure a convenient appointment time.  9. If you have disability or family leave forms that need to be completed, you may have  them completed by your primary care physician's office; for return to work instructions, please ask our office staff and they will be happy to assist you in obtaining this documentation   When to call us [phone]: Poor pain control Reactions / problems with new medications (rash/itching, etc)  Fever over 101.5 F (38.5 C) Inability to urinate Nausea/vomiting Worsening swelling or bruising Continued bleeding from incision. Increased pain, redness, or drainage from the incision  The clinic staff is available to answer your questions during regular business hours (8:30am-5pm).  Please don't hesitate to call and ask to speak to one of our nurses for clinical concerns.   A surgeon from [facility] is always on call at the hospitals   If you have a medical emergency, go to the nearest emergency room or call 911.  [facility] [address], [address], [place],   [address] MAIN: [phone] FAX: [phone] [email or web address]  

## 2023-02-04 ENCOUNTER — Encounter: Payer: Self-pay | Admitting: Nurse Practitioner

## 2023-02-04 ENCOUNTER — Ambulatory Visit (INDEPENDENT_AMBULATORY_CARE_PROVIDER_SITE_OTHER): Payer: Self-pay | Admitting: Nurse Practitioner

## 2023-02-04 VITALS — BP 117/58 | HR 76 | Temp 97.2°F | Ht 60.24 in | Wt 148.8 lb

## 2023-02-04 DIAGNOSIS — E119 Type 2 diabetes mellitus without complications: Secondary | ICD-10-CM

## 2023-02-04 DIAGNOSIS — Z9889 Other specified postprocedural states: Secondary | ICD-10-CM

## 2023-02-04 NOTE — Assessment & Plan Note (Signed)
-  continue current medications  2. Status post colostomy takedown  Op-sites well healing  -follow with surgeon as scheduled  Follow up:  Follow up as needed

## 2023-02-04 NOTE — Patient Instructions (Addendum)
1. Type 2 diabetes mellitus without complication, without long-term current use of insulin ([place]) -continue current medications  2. Status post colostomy takedown  Op-sites well healing  -follow with surgeon as scheduled  Follow up:  Follow up as needed

## 2023-02-04 NOTE — Progress Notes (Signed)
$'@Patient'y$  ID: Heather Vang, female    DOB: 1951/10/13, 72 y.o.   MRN: [id number]  Chief Complaint  Patient presents with   Hospitalization Follow-up    Referring provider: No ref. provider found   HPI  Patient presents today for hospital follow-up.  She was recently admitted to the hospital on 01/28/2023 for robotic ostomy takedown.  Patient recovered well and was discharged home.  Patient is from [place] and does expect to return back there as soon as she is cleared.  She does have a follow-up with the surgeon on March 13.  Patient does have all of her medications that she was prescribed at discharge.  Overall she is doing really well since hospital discharge she states that she is not having any pain.  Laparoscopic incision sites are well-healing.  Old ostomy site is well-healing. Denies f/c/s, n/v/d, hemoptysis, PND, leg swelling Denies chest pain or edema    No Known Allergies   There is no immunization history on file for this patient.  Past Medical History:  Diagnosis Date   Anxiety    Diabetes mellitus without complication ([place])    Hypertension    Hypothyroidism    Thyroid disease     Tobacco History: Social History   Tobacco Use  Smoking Status Former   Types: Cigarettes  Smokeless Tobacco Never   Counseling given: Not Answered   Outpatient Encounter Medications as of 02/04/2023  Medication Sig   aspirin EC 81 MG tablet Take 81 mg by mouth at bedtime. Swallow whole.   EMPAGLIFLOZIN-METFORMIN HCL PO Take 1 tablet by mouth in the morning and at bedtime. Lequita Asal from [place]: Empagliflozin/metformin 12.'5mg'$ /'850mg'$    levothyroxine (SYNTHROID) 100 MCG tablet Take 100 mcg by mouth daily before breakfast.   methocarbamol (ROBAXIN-750) 750 MG tablet Take 1 tablet (750 mg total) by mouth 4 (four) times daily.   mirtazapine (REMERON) 15 MG tablet Take 15 mg by mouth at bedtime.   oxyCODONE-acetaminophen (PERCOCET) 5-325 MG tablet Take 1 tablet by mouth every  4 (four) hours as needed for severe pain.   simvastatin (ZOCOR) 10 MG tablet Take 10 mg by mouth at bedtime.   No facility-administered encounter medications on file as of 02/04/2023.     Review of Systems  Review of Systems  Constitutional: Negative.   HENT: Negative.    Cardiovascular: Negative.   Gastrointestinal: Negative.   Allergic/Immunologic: Negative.   Neurological: Negative.   Psychiatric/Behavioral: Negative.         Physical Exam  BP (!) 117/58   Pulse 76   Temp (!) 97.2 F (36.2 C)   Ht 5' 0.24" (1.53 m)   Wt 148 lb 12.8 oz (67.5 kg)   SpO2 97%   BMI 28.83 kg/m   Wt Readings from Last 5 Encounters:  02/04/23 148 lb 12.8 oz (67.5 kg)  01/31/23 194 lb 0.1 oz (88 kg)  01/12/23 137 lb (62.1 kg)  12/18/22 152 lb (68.9 kg)  12/14/22 152 lb (68.9 kg)     Physical Exam Vitals and nursing note reviewed.  Constitutional:      General: She is not in acute distress.    Appearance: She is well-developed.  Cardiovascular:     Rate and Rhythm: Normal rate and regular rhythm.  Pulmonary:     Effort: Pulmonary effort is normal.     Breath sounds: Normal breath sounds.  Skin:      Neurological:     Mental Status: She is alert and oriented to person, place,  and time.      Lab Results:  CBC    Component Value Date/Time   WBC 9.5 02/01/2023 0349   RBC 3.71 (L) 02/01/2023 0349   HGB 11.2 (L) 02/01/2023 0349   HCT 33.3 (L) 02/01/2023 0349   PLT 291 02/01/2023 0349   MCV 89.8 02/01/2023 0349   MCH 30.2 02/01/2023 0349   MCHC 33.6 02/01/2023 0349   RDW 15.9 (H) 02/01/2023 0349   LYMPHSABS 1.8 07/02/2022 0033   MONOABS 0.9 07/02/2022 0033   EOSABS 0.0 07/02/2022 0033   BASOSABS 0.1 07/02/2022 0033    BMET    Component Value Date/Time   NA 138 02/01/2023 0349   K 2.8 (L) 02/01/2023 0349   CL 106 02/01/2023 0349   CO2 23 02/01/2023 0349   GLUCOSE 126 (H) 02/01/2023 0349   BUN 6 (L) 02/01/2023 0349   CREATININE 0.48 02/01/2023 0349   CALCIUM  8.6 (L) 02/01/2023 0349   GFRNONAA >60 02/01/2023 0349    BNP No results found for: "BNP"  ProBNP No results found for: "PROBNP"  Imaging: No results found.   Assessment & Plan:   DM2 (diabetes mellitus, type 2) ([place]) -continue current medications  2. Status post colostomy takedown  Op-sites well healing  -follow with surgeon as scheduled  Follow up:  Follow up as needed     Fenton Foy, NP 02/04/2023
